# Patient Record
Sex: Female | Born: 1965 | Race: Black or African American | Hispanic: No | Smoking: Current every day smoker
Health system: Southern US, Community
[De-identification: ages and names within clinical notes are randomized; demographics above are authoritative.]

## PROBLEM LIST (undated history)

## (undated) DIAGNOSIS — F319 Bipolar disorder, unspecified: Secondary | ICD-10-CM

## (undated) DIAGNOSIS — J45909 Unspecified asthma, uncomplicated: Secondary | ICD-10-CM

## (undated) DIAGNOSIS — I10 Essential (primary) hypertension: Secondary | ICD-10-CM

## (undated) HISTORY — PX: APPENDECTOMY: SHX54

## (undated) HISTORY — PX: HERNIA REPAIR: SHX51

---

## 2000-04-07 ENCOUNTER — Encounter: Payer: Self-pay | Admitting: Emergency Medicine

## 2000-04-07 ENCOUNTER — Emergency Department (HOSPITAL_COMMUNITY): Admission: EM | Admit: 2000-04-07 | Discharge: 2000-04-07 | Payer: Self-pay | Admitting: Emergency Medicine

## 2000-12-17 ENCOUNTER — Other Ambulatory Visit: Admission: RE | Admit: 2000-12-17 | Discharge: 2000-12-17 | Payer: Self-pay | Admitting: Obstetrics and Gynecology

## 2001-03-01 ENCOUNTER — Other Ambulatory Visit: Admission: RE | Admit: 2001-03-01 | Discharge: 2001-03-01 | Payer: Self-pay | Admitting: Obstetrics and Gynecology

## 2002-03-13 ENCOUNTER — Other Ambulatory Visit: Admission: RE | Admit: 2002-03-13 | Discharge: 2002-03-13 | Payer: Self-pay | Admitting: Emergency Medicine

## 2002-03-13 ENCOUNTER — Inpatient Hospital Stay (HOSPITAL_COMMUNITY): Admission: AD | Admit: 2002-03-13 | Discharge: 2002-03-13 | Payer: Self-pay | Admitting: Obstetrics and Gynecology

## 2005-12-01 ENCOUNTER — Ambulatory Visit: Payer: Self-pay | Admitting: Family Medicine

## 2005-12-05 ENCOUNTER — Ambulatory Visit (HOSPITAL_COMMUNITY): Admission: RE | Admit: 2005-12-05 | Discharge: 2005-12-05 | Payer: Self-pay | Admitting: Family Medicine

## 2005-12-23 ENCOUNTER — Ambulatory Visit: Payer: Self-pay | Admitting: Family Medicine

## 2012-10-19 ENCOUNTER — Encounter (HOSPITAL_COMMUNITY): Payer: Self-pay | Admitting: Emergency Medicine

## 2012-10-19 ENCOUNTER — Emergency Department (INDEPENDENT_AMBULATORY_CARE_PROVIDER_SITE_OTHER)
Admission: EM | Admit: 2012-10-19 | Discharge: 2012-10-19 | Disposition: A | Discharge status: Home / Self Care | Payer: PRIVATE HEALTH INSURANCE | Source: Home / Self Care | Attending: Family Medicine | Admitting: Family Medicine

## 2012-10-19 DIAGNOSIS — J069 Acute upper respiratory infection, unspecified: Secondary | ICD-10-CM

## 2012-10-19 HISTORY — DX: Unspecified asthma, uncomplicated: J45.909

## 2012-10-19 HISTORY — DX: Essential (primary) hypertension: I10

## 2012-10-19 MED ORDER — ONDANSETRON HCL 4 MG PO TABS: 4.0000 mg | ORAL_TABLET | Freq: Four times a day (QID) | ORAL | Status: DC

## 2012-10-19 MED ORDER — ONDANSETRON 4 MG PO TBDP
ORAL_TABLET | ORAL | Status: AC
  Filled 2012-10-19: qty 1

## 2012-10-19 MED ORDER — ONDANSETRON 4 MG PO TBDP
4.0000 mg | ORAL_TABLET | Freq: Once | ORAL | Status: AC
  Administered 2012-10-19: 4 mg via ORAL

## 2012-10-19 MED ORDER — CETIRIZINE HCL 10 MG PO TABS: 10.0000 mg | ORAL_TABLET | Freq: Every day | ORAL | Status: DC

## 2012-10-19 NOTE — ED Notes (Signed)
Reports: symptoms started Friday with a scratchy throat. Saturday congestion and body aches. Monday nausea and diarrhea with low grade temp.  Pt has been taking tylenol cold and flu with mild relief.

## 2012-10-19 NOTE — ED Provider Notes (Signed)
History     CSN: [id number]  Arrival date & time 10/19/12  1431   First MD Initiated Contact with Patient 10/19/12 1552      Chief Complaint  Patient presents with  . Influenza    scratchy throat. congestion. body aches. diarrhea.     (Consider location/radiation/quality/duration/timing/severity/associated sxs/prior treatment) Patient is a 47 y.o. female presenting with flu symptoms. The history is provided by the patient.  Influenza Presenting symptoms: cough, diarrhea, myalgias, nausea, rhinorrhea and vomiting   Presenting symptoms: no shortness of breath   Severity:  Mild Progression:  Unchanged Associated symptoms: nasal congestion     Past Medical History  Diagnosis Date  . Asthma   . Hypertension     Past Surgical History  Procedure Laterality Date  . Hernia repair    . Appendectomy      History reviewed. No pertinent family history.  History  Substance Use Topics  . Smoking status: Current Every Day Smoker -- 0.50 packs/day    Types: Cigarettes  . Smokeless tobacco: Not on file  . Alcohol Use: Yes    OB History   Grav Para Term Preterm Abortions TAB SAB Ect Mult Living                  Review of Systems  Constitutional: Positive for appetite change.  HENT: Positive for congestion and rhinorrhea.   Respiratory: Positive for cough. Negative for shortness of breath.   Cardiovascular: Negative for leg swelling.  Gastrointestinal: Positive for nausea, vomiting and diarrhea.  Genitourinary: Negative.  Negative for dysuria, urgency and frequency.  Musculoskeletal: Positive for myalgias.    Allergies  Review of patient's allergies indicates no known allergies.  Home Medications   Current Outpatient Rx  Name  Route  Sig  Dispense  Refill  . Esomeprazole Magnesium (NEXIUM PO)   Oral   Take by mouth.         . cetirizine (ZYRTEC) 10 MG tablet   Oral   Take 1 tablet (10 mg total) by mouth daily. One tab daily for allergies   30 tablet   1    . ondansetron (ZOFRAN) 4 MG tablet   Oral   Take 1 tablet (4 mg total) by mouth every 6 (six) hours.   6 tablet   0     BP 120/68  Pulse 94  Temp(Src) 99.7 F (37.6 C) (Oral)  Resp 16  SpO2 100%  LMP 10/19/2012  Physical Exam  Nursing note and vitals reviewed. Constitutional: She is oriented to person, place, and time. She appears well-developed and well-nourished. No distress.  HENT:  Right Ear: External ear normal.  Left Ear: External ear normal.  Mouth/Throat: Oropharynx is clear and moist.  Neck: Normal range of motion. Neck supple.  Cardiovascular: Regular rhythm and normal heart sounds.   Pulmonary/Chest: Effort normal. She has no wheezes. She has no rales.  Abdominal: Soft. Bowel sounds are normal.  Lymphadenopathy:    She has no cervical adenopathy.  Neurological: She is alert and oriented to person, place, and time.  Skin: Skin is warm and dry.    ED Course  Procedures (including critical care time)  Labs Reviewed - No data to display No results found.   1. Acute URI       MDM          Linna Hoff, MD 10/19/12 1651

## 2012-11-19 ENCOUNTER — Emergency Department (HOSPITAL_COMMUNITY)
Admission: EM | Admit: 2012-11-19 | Discharge: 2012-11-19 | Disposition: A | Discharge status: Home / Self Care | Payer: PRIVATE HEALTH INSURANCE | Attending: Emergency Medicine | Admitting: Emergency Medicine

## 2012-11-19 ENCOUNTER — Emergency Department (HOSPITAL_COMMUNITY): Payer: PRIVATE HEALTH INSURANCE

## 2012-11-19 ENCOUNTER — Encounter (HOSPITAL_COMMUNITY): Payer: Self-pay | Admitting: Emergency Medicine

## 2012-11-19 DIAGNOSIS — Z79899 Other long term (current) drug therapy: Secondary | ICD-10-CM | POA: Insufficient documentation

## 2012-11-19 DIAGNOSIS — F17200 Nicotine dependence, unspecified, uncomplicated: Secondary | ICD-10-CM | POA: Insufficient documentation

## 2012-11-19 DIAGNOSIS — R1013 Epigastric pain: Secondary | ICD-10-CM | POA: Insufficient documentation

## 2012-11-19 DIAGNOSIS — J45909 Unspecified asthma, uncomplicated: Secondary | ICD-10-CM | POA: Insufficient documentation

## 2012-11-19 DIAGNOSIS — R110 Nausea: Secondary | ICD-10-CM | POA: Insufficient documentation

## 2012-11-19 DIAGNOSIS — Z3202 Encounter for pregnancy test, result negative: Secondary | ICD-10-CM | POA: Insufficient documentation

## 2012-11-19 DIAGNOSIS — I10 Essential (primary) hypertension: Secondary | ICD-10-CM | POA: Insufficient documentation

## 2012-11-19 DIAGNOSIS — R109 Unspecified abdominal pain: Secondary | ICD-10-CM

## 2012-11-19 LAB — CBC WITH DIFFERENTIAL/PLATELET
Basophils Absolute: 0 10*3/uL (ref 0.0–0.1)
Basophils Relative: 0 % (ref 0–1)
Eosinophils Absolute: 0.1 10*3/uL (ref 0.0–0.7)
Eosinophils Relative: 1 % (ref 0–5)
HCT: 41.3 % (ref 36.0–46.0)
Hemoglobin: 14 g/dL (ref 12.0–15.0)
Lymphocytes Relative: 19 % (ref 12–46)
Lymphs Abs: 1.3 10*3/uL (ref 0.7–4.0)
MCH: 33 pg (ref 26.0–34.0)
MCHC: 33.9 g/dL (ref 30.0–36.0)
MCV: 97.4 fL (ref 78.0–100.0)
Monocytes Absolute: 0.7 10*3/uL (ref 0.1–1.0)
Monocytes Relative: 10 % (ref 3–12)
Neutro Abs: 4.9 10*3/uL (ref 1.7–7.7)
Neutrophils Relative %: 70 % (ref 43–77)
Platelets: 252 10*3/uL (ref 150–400)
RBC: 4.24 MIL/uL (ref 3.87–5.11)
RDW: 13.7 % (ref 11.5–15.5)
WBC: 7.1 10*3/uL (ref 4.0–10.5)

## 2012-11-19 LAB — LIPASE, BLOOD: Lipase: 27 U/L (ref 11–59)

## 2012-11-19 LAB — COMPREHENSIVE METABOLIC PANEL
ALT: 13 U/L (ref 0–35)
AST: 19 U/L (ref 0–37)
Albumin: 3.6 g/dL (ref 3.5–5.2)
Alkaline Phosphatase: 73 U/L (ref 39–117)
BUN: 8 mg/dL (ref 6–23)
CO2: 27 mEq/L (ref 19–32)
Calcium: 9 mg/dL (ref 8.4–10.5)
Chloride: 103 mEq/L (ref 96–112)
Creatinine, Ser: 0.93 mg/dL (ref 0.50–1.10)
GFR calc Af Amer: 85 mL/min — ABNORMAL LOW (ref >90)
GFR calc non Af Amer: 73 mL/min — ABNORMAL LOW (ref >90)
Glucose, Bld: 88 mg/dL (ref 70–99)
Potassium: 3.8 mEq/L (ref 3.5–5.1)
Sodium: 138 mEq/L (ref 135–145)
Total Bilirubin: 1.7 mg/dL — ABNORMAL HIGH (ref 0.3–1.2)
Total Protein: 7.3 g/dL (ref 6.0–8.3)

## 2012-11-19 LAB — URINE MICROSCOPIC-ADD ON

## 2012-11-19 LAB — URINALYSIS, ROUTINE W REFLEX MICROSCOPIC
Bilirubin Urine: NEGATIVE
Glucose, UA: NEGATIVE mg/dL
Hgb urine dipstick: NEGATIVE
Ketones, ur: NEGATIVE mg/dL
Leukocytes, UA: NEGATIVE
Nitrite: NEGATIVE
Protein, ur: NEGATIVE mg/dL
Specific Gravity, Urine: 1.017 (ref 1.005–1.030)
Urobilinogen, UA: 1 mg/dL (ref 0.0–1.0)
pH: 8.5 — ABNORMAL HIGH (ref 5.0–8.0)

## 2012-11-19 LAB — PREGNANCY, URINE: Preg Test, Ur: NEGATIVE

## 2012-11-19 MED ORDER — PANTOPRAZOLE SODIUM 40 MG IV SOLR
40.0000 mg | Freq: Once | INTRAVENOUS | Status: AC
  Administered 2012-11-19: 40 mg via INTRAVENOUS
  Filled 2012-11-19: qty 40

## 2012-11-19 MED ORDER — TRAMADOL HCL 50 MG PO TABS: 50.0000 mg | ORAL_TABLET | Freq: Four times a day (QID) | ORAL | Status: DC | PRN

## 2012-11-19 NOTE — ED Provider Notes (Signed)
History     CSN: [id number]  Arrival date & time 11/19/12  1433   First MD Initiated Contact with Patient 11/19/12 1543      Chief Complaint  Patient presents with  . Abdominal Pain  . Nausea    (Consider location/radiation/quality/duration/timing/severity/associated sxs/prior treatment) Patient is a 47 y.o. female presenting with abdominal pain. The history is provided by the patient (the pt complains of epigastric pain).  Abdominal Pain Pain location:  Epigastric Pain quality: aching   Pain radiates to:  Does not radiate Pain severity:  Moderate Onset quality:  Gradual Timing:  Intermittent Progression:  Unchanged Chronicity:  Recurrent Context: not alcohol use   Associated symptoms: no chest pain, no cough, no diarrhea, no fatigue and no hematuria     Past Medical History  Diagnosis Date  . Asthma   . Hypertension     Past Surgical History  Procedure Laterality Date  . Hernia repair    . Appendectomy      History reviewed. No pertinent family history.  History  Substance Use Topics  . Smoking status: Current Every Day Smoker -- 0.50 packs/day    Types: Cigarettes  . Smokeless tobacco: Not on file  . Alcohol Use: Yes    OB History   Grav Para Term Preterm Abortions TAB SAB Ect Mult Living                  Review of Systems  Constitutional: Negative for appetite change and fatigue.  HENT: Negative for congestion, sinus pressure and ear discharge.   Eyes: Negative for discharge.  Respiratory: Negative for cough.   Cardiovascular: Negative for chest pain.  Gastrointestinal: Positive for abdominal pain. Negative for diarrhea.  Genitourinary: Negative for frequency and hematuria.  Musculoskeletal: Negative for back pain.  Skin: Negative for rash.  Neurological: Negative for seizures and headaches.  Psychiatric/Behavioral: Negative for hallucinations.    Allergies  Review of patient's allergies indicates no known allergies.  Home Medications    Current Outpatient Rx  Name  Route  Sig  Dispense  Refill  . esomeprazole (NEXIUM) 40 MG capsule   Oral   Take 40 mg by mouth daily before breakfast.         . traMADol (ULTRAM) 50 MG tablet   Oral   Take 1 tablet (50 mg total) by mouth every 6 (six) hours as needed for pain.   15 tablet   0     BP 108/73  Pulse 81  Temp(Src) 98.5 F (36.9 C) (Oral)  Resp 20  SpO2 100%  LMP 11/12/2012  Physical Exam  Constitutional: She is oriented to person, place, and time. She appears well-developed.  HENT:  Head: Normocephalic.  Eyes: Conjunctivae and EOM are normal. No scleral icterus.  Neck: Neck supple. No thyromegaly present.  Cardiovascular: Normal rate and regular rhythm.  Exam reveals no gallop and no friction rub.   No murmur heard. Pulmonary/Chest: No stridor. She has no wheezes. She has no rales. She exhibits no tenderness.  Abdominal: She exhibits no distension. There is tenderness. There is no rebound.  Musculoskeletal: Normal range of motion. She exhibits no edema.  Lymphadenopathy:    She has no cervical adenopathy.  Neurological: She is oriented to person, place, and time. Coordination normal.  Skin: No rash noted. No erythema.  Psychiatric: She has a normal mood and affect. Her behavior is normal.    ED Course  Procedures (including critical care time)  Labs Reviewed  COMPREHENSIVE METABOLIC PANEL -  Abnormal; Notable for the following:    Total Bilirubin 1.7 (*)    GFR calc non Af Amer 73 (*)    GFR calc Af Amer 85 (*)    All other components within normal limits  URINALYSIS, ROUTINE W REFLEX MICROSCOPIC - Abnormal; Notable for the following:    APPearance TURBID (*)    pH 8.5 (*)    All other components within normal limits  URINE MICROSCOPIC-ADD ON - Abnormal; Notable for the following:    Squamous Epithelial / LPF FEW (*)    Bacteria, UA FEW (*)    All other components within normal limits  LIPASE, BLOOD  CBC WITH DIFFERENTIAL  PREGNANCY, URINE    Dg Abd Acute W/chest  11/19/2012  *RADIOLOGY REPORT*  Clinical Data: Upper abdominal pain for 1 week, history smoking, asthma, hypertension  ACUTE ABDOMEN SERIES (ABDOMEN 2 VIEW & CHEST 1 VIEW)  Comparison: None  Findings: Normal heart size, mediastinal contours, and pulmonary vascularity. Peribronchial thickening without infiltrate, pleural effusion or pneumothorax. Scattered stool throughout colon. IUD projects over pelvis. Nonobstructive bowel gas pattern. No bowel dilatation, bowel wall thickening, or free intraperitoneal air. No urinary tract calcification or acute osseous findings.  IMPRESSION: No acute abdominal findings. Peribronchial thickening question bronchitis versus asthma.   Original Report Authenticated By: Ulyses Southward, M.D.      1. Abdominal pain       MDM          Benny Lennert, MD 11/19/12 [phone]

## 2012-11-19 NOTE — ED Notes (Signed)
Pt c/o abd pain after eating with some nausea x several weeks; pt sts hx of similar

## 2012-11-19 NOTE — ED Notes (Signed)
Patient transported to X-ray 

## 2012-11-19 NOTE — ED Notes (Signed)
Patient said she has been having abdominal pain for a week now and she has been managing the pain at home with motrin.  Also her mom gave her a "pain pill".  She said what made her come in is that she started getting nauseous with food and it worried her so she came to the ED to be evaluated.

## 2013-05-26 ENCOUNTER — Encounter (HOSPITAL_COMMUNITY): Payer: Self-pay | Admitting: Emergency Medicine

## 2013-05-26 ENCOUNTER — Emergency Department (HOSPITAL_COMMUNITY): Payer: Self-pay

## 2013-05-26 ENCOUNTER — Emergency Department (HOSPITAL_COMMUNITY)
Admission: EM | Admit: 2013-05-26 | Discharge: 2013-05-26 | Disposition: A | Discharge status: Home / Self Care | Payer: PRIVATE HEALTH INSURANCE | Attending: Emergency Medicine | Admitting: Emergency Medicine

## 2013-05-26 DIAGNOSIS — J45909 Unspecified asthma, uncomplicated: Secondary | ICD-10-CM | POA: Insufficient documentation

## 2013-05-26 DIAGNOSIS — Z79899 Other long term (current) drug therapy: Secondary | ICD-10-CM | POA: Insufficient documentation

## 2013-05-26 DIAGNOSIS — S92501A Displaced unspecified fracture of right lesser toe(s), initial encounter for closed fracture: Secondary | ICD-10-CM

## 2013-05-26 DIAGNOSIS — W2203XA Walked into furniture, initial encounter: Secondary | ICD-10-CM | POA: Insufficient documentation

## 2013-05-26 DIAGNOSIS — Y9301 Activity, walking, marching and hiking: Secondary | ICD-10-CM | POA: Insufficient documentation

## 2013-05-26 DIAGNOSIS — I10 Essential (primary) hypertension: Secondary | ICD-10-CM | POA: Insufficient documentation

## 2013-05-26 DIAGNOSIS — Y929 Unspecified place or not applicable: Secondary | ICD-10-CM | POA: Insufficient documentation

## 2013-05-26 DIAGNOSIS — F17200 Nicotine dependence, unspecified, uncomplicated: Secondary | ICD-10-CM | POA: Insufficient documentation

## 2013-05-26 DIAGNOSIS — S92919A Unspecified fracture of unspecified toe(s), initial encounter for closed fracture: Secondary | ICD-10-CM | POA: Insufficient documentation

## 2013-05-26 DIAGNOSIS — R269 Unspecified abnormalities of gait and mobility: Secondary | ICD-10-CM | POA: Insufficient documentation

## 2013-05-26 DIAGNOSIS — R209 Unspecified disturbances of skin sensation: Secondary | ICD-10-CM | POA: Insufficient documentation

## 2013-05-26 MED ORDER — HYDROCODONE-ACETAMINOPHEN 5-325 MG PO TABS: 1.0000 | ORAL_TABLET | Freq: Four times a day (QID) | ORAL | Status: DC | PRN

## 2013-05-26 NOTE — Progress Notes (Signed)
Orthopedic Tech Progress Note Patient Details:  Katherine Ramirez 05-05-1966 [id number] SLS applied to Right LE. Posterior. Crutches fit for height and comfort.  Ortho Devices Type of Ortho Device: Crutches;Short leg splint Ortho Device/Splint Location: Right LE Ortho Device/Splint Interventions: Application   Asia R Thompson 05/26/2013, 1:52 PM

## 2013-05-26 NOTE — ED Notes (Signed)
Pt reports hitting right foot on furniture early this morning. Pt has pain and swelling to right little toe.

## 2013-05-26 NOTE — ED Notes (Signed)
Waiting on ortho to place splints.

## 2013-05-26 NOTE — ED Provider Notes (Signed)
CSN: [id number]     Arrival date & time 05/26/13  1043 History  This chart was scribed for non-physician practitioner working with Katherine Camel, MD by Ashley Jacobs, ED scribe. This patient was seen in room TR04C/TR04C and the patient's care was started at 12:29 PM.  First MD Initiated Contact with Patient 05/26/13 1133     Chief Complaint  Patient presents with  . Foot Pain   (Consider location/radiation/quality/duration/timing/severity/associated sxs/prior Treatment) The history is provided by the patient and medical records. No language interpreter was used.   HPI Comments: Katherine Ramirez is a 47 y.o. female who presents to the Emergency Department complaining of constant, sharp, right foot pain that occurred this morning after hitting his foot on furniture. She states the pain started immediately after impact.   Pt has the associated symptom of swelling,numbness and slight tingling in her right fifth toe. The pain is worse with the application of weight and upon walking. She has a previous injury to her right foot. She had a past medical hx of asthma and HTN. Pt smokes tobacco every day and drinks alcohol.  Past Medical History  Diagnosis Date  . Asthma   . Hypertension    Past Surgical History  Procedure Laterality Date  . Hernia repair    . Appendectomy     History reviewed. No pertinent family history. History  Substance Use Topics  . Smoking status: Current Every Day Smoker -- 0.50 packs/day    Types: Cigarettes  . Smokeless tobacco: Not on file  . Alcohol Use: Yes   OB History   Grav Para Term Preterm Abortions TAB SAB Ect Mult Living                 Review of Systems  Musculoskeletal: Positive for arthralgias (right fifth toe) and gait problem.  Neurological: Positive for numbness.  All other systems reviewed and are negative.    Allergies  Review of patient's allergies indicates no known allergies.  Home Medications   Current Outpatient Rx  Name   Route  Sig  Dispense  Refill  . esomeprazole (NEXIUM) 40 MG capsule   Oral   Take 40 mg by mouth daily before breakfast.         . ezetimibe (ZETIA) 10 MG tablet   Oral   Take 10 mg by mouth daily.         Marland Kitchen FLUoxetine (PROZAC) 20 MG tablet   Oral   Take 20 mg by mouth daily.         . traMADol (ULTRAM) 50 MG tablet   Oral   Take 1 tablet (50 mg total) by mouth every 6 (six) hours as needed for pain.   15 tablet   0   . HYDROcodone-acetaminophen (NORCO/VICODIN) 5-325 MG per tablet   Oral   Take 1 tablet by mouth every 6 (six) hours as needed.   7 tablet   0    BP 117/78  Pulse 72  Temp(Src) 98.1 F (36.7 C) (Oral)  Resp 20  Ht 5\' 3"  (1.6 m)  Wt 151 lb 4.8 oz (68.629 kg)  BMI 26.81 kg/m2  SpO2 98% Physical Exam  Nursing note and vitals reviewed. Constitutional: She is oriented to person, place, and time. She appears well-developed and well-nourished. No distress.  HENT:  Head: Normocephalic and atraumatic.  Cardiovascular: Normal rate, regular rhythm and normal heart sounds.  Exam reveals no friction rub.   No murmur heard. Pulses:      Radial  pulses are 2+ on the right side, and 2+ on the left side.       Dorsalis pedis pulses are 2+ on the right side, and 2+ on the left side.  Pulmonary/Chest: Effort normal and breath sounds normal. No respiratory distress. She has no wheezes. She has no rales.  Musculoskeletal: She exhibits tenderness.  Swelling localized to the fifth digit of the right foot-mild ecchymosis identified. Pain upon palpation to the fifth digit of the right foot with discomfort upon the lateral aspects of the right foot. Full range of motion to remaining digits, decreased range of motion to fifth digit secondary to pain.  Neurological: She is alert and oriented to person, place, and time. She exhibits normal muscle tone. Coordination normal.  Strength 5+/5+ bilateral lower extremities Sensation intact with differentiation to sharp and dull touch.   Skin: Skin is warm and dry. No rash noted. She is not diaphoretic. No erythema.  Psychiatric: She has a normal mood and affect. Her behavior is normal. Thought content normal.    ED Course  Procedures (including critical care time) DIAGNOSTIC STUDIES: Oxygen Saturation is 98% on room air, normal by my interpretation.    COORDINATION OF CARE: 12:34 PM Discussed course of care with pt . Pt understands and agrees.  Labs Review Labs Reviewed - No data to display Imaging Review Dg Foot Complete Right  05/26/2013   CLINICAL DATA:  47 year old female with pain and swelling after injury. Initial encounter.  EXAM: RIGHT FOOT COMPLETE - 3+ VIEW  COMPARISON:  None.  FINDINGS: Oblique perhaps mildly comminuted intra-articular fracture at the base of the right 5th proximal phalanx. Lateral foot soft tissue swelling. No dislocation. Fifth metacarpal intact.  Other osseous structures appear intact. Bone mineralization is within normal limits.  IMPRESSION: Mildly comminuted intra-articular fracture at the base of the right 5th proximal phalanx.   Electronically Signed   By: Augusto Gamble M.D.   On: 05/26/2013 12:07    EKG Interpretation   None       MDM   1. Closed fracture of fifth toe of right foot, initial encounter     Filed Vitals:   05/26/13 1103 05/26/13 1313  BP: 117/78 114/76  Pulse: 72 76  Temp: 98.1 F (36.7 C)   TempSrc: Oral   Resp: 20 18  Height: 5\' 3"  (1.6 m)   Weight: 151 lb 4.8 oz (68.629 kg)   SpO2: 98% 100%    I personally performed the services described in this documentation, which was scribed in my presence. The recorded information has been reviewed and is accurate.  Patient presenting to emergency department with fifth digit of right foot pain secondary to hitting the toe on the door this morning. Alert and oriented. Swelling and mild ecchymosis to the fifth digit of the right foot. Pain upon palpation to the fifth digit of the right foot with discomfort on the  lateral aspect of the right foot. Decreased range of motion to the fifth digit of the right foot secondary to pain. Pulses palpable and strong, DP 2+ bilaterally. Sensation intact. Patient neurovascularly intact. Plain film of right foot noted mildly comminuted intra-articular fracture at the base of the right fifth proximal phalanx-closed fracture. Patient presenting with closed fracture to the fifth digit of the right foot. Patient placed in splint and crutches administered. Small dose of pain medications administered-discussed course, precautions, disposal technique. Discussed with patient to rest and stay hydrated. Discussed with patient to keep foot elevated. Discussed with patient to  avoid any physical or strenuous activities. Referred to orthopedics. Discussed with patient to closely monitor symptoms and if symptoms are to worsen or change report back to emergency department-strict return structures given. Patient agreed to plan of care, understood, all cautions answered.  Raymon Mutton, PA-C 05/26/13 2155

## 2013-05-26 NOTE — ED Notes (Signed)
Ortho tech still in with pt.

## 2013-05-27 NOTE — ED Provider Notes (Signed)
Medical screening examination/treatment/procedure(s) were performed by non-physician practitioner and as supervising physician I was immediately available for consultation/collaboration.  EKG Interpretation   None         Otie Headlee T Oniyah Rohe, MD 05/27/13 0703 

## 2016-03-20 ENCOUNTER — Emergency Department (HOSPITAL_COMMUNITY)
Admission: EM | Admit: 2016-03-20 | Discharge: 2016-03-20 | Disposition: A | Discharge status: Home / Self Care | Payer: PRIVATE HEALTH INSURANCE | Attending: Emergency Medicine | Admitting: Emergency Medicine

## 2016-03-20 ENCOUNTER — Encounter (HOSPITAL_COMMUNITY): Payer: Self-pay | Admitting: Emergency Medicine

## 2016-03-20 ENCOUNTER — Emergency Department (HOSPITAL_COMMUNITY): Payer: PRIVATE HEALTH INSURANCE

## 2016-03-20 DIAGNOSIS — M778 Other enthesopathies, not elsewhere classified: Secondary | ICD-10-CM | POA: Insufficient documentation

## 2016-03-20 DIAGNOSIS — T148XXA Other injury of unspecified body region, initial encounter: Secondary | ICD-10-CM

## 2016-03-20 DIAGNOSIS — I10 Essential (primary) hypertension: Secondary | ICD-10-CM | POA: Insufficient documentation

## 2016-03-20 DIAGNOSIS — F17210 Nicotine dependence, cigarettes, uncomplicated: Secondary | ICD-10-CM | POA: Insufficient documentation

## 2016-03-20 DIAGNOSIS — J45909 Unspecified asthma, uncomplicated: Secondary | ICD-10-CM | POA: Insufficient documentation

## 2016-03-20 DIAGNOSIS — M25532 Pain in left wrist: Secondary | ICD-10-CM

## 2016-03-20 DIAGNOSIS — X503XXA Overexertion from repetitive movements, initial encounter: Secondary | ICD-10-CM | POA: Insufficient documentation

## 2016-03-20 MED ORDER — IBUPROFEN 800 MG PO TABS
800.0000 mg | ORAL_TABLET | Freq: Once | ORAL | Status: AC
  Administered 2016-03-20: 800 mg via ORAL
  Filled 2016-03-20: qty 1

## 2016-03-20 NOTE — Discharge Instructions (Addendum)
Wear wrist brace for the next 1-2 weeks as needed for comfort. Ice and elevate wrist throughout the day, using ice pack for no more than 20 minutes every hour.  Alternate between tylenol and motrin for pain relief. Call hand specialist follow up today or tomorrow to schedule followup appointment for recheck of ongoing wrist pain in 1-2 weeks that can be canceled with a 24-48 hour notice if complete resolution of pain. Return to the ER for changes or worsening symptoms.

## 2016-03-20 NOTE — ED Triage Notes (Signed)
Pt reports l/wrist pain x 3 days. Denies trauma, reports that she lifts heavy boxes at work

## 2016-03-20 NOTE — ED Provider Notes (Signed)
[facility]-EMERGENCY DEPT Provider Note   CSN: [id number] Arrival date & time: 03/20/16  1654  By signing my name below, I, Soijett Blue, attest that this documentation has been prepared under the direction and in the presence of Levi StraussMercedes Ramirez, VF CorporationPA-C Electronically Signed: Soijett Blue, ED Scribe. 03/20/16. 5:40 PM.   History   Chief Complaint Chief Complaint  Patient presents with  . Wrist Pain    pain x 3 days    HPI Katherine Ramirez is a 50 y.o. female with a PMHx of HTN, who presents to the Emergency Department complaining of left medial wrist pain onset 3 days. 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 Pt describes her left medial wrist pain as 9/10, constant, sharp/burning, and it radiates up her right forearm. She states that her left medial wrist pain is worsened with medial deviation and pronation movements. She reports that she has tried heat/cold compresses and ASA with mild relief for her symptoms. Pt is having associated symptoms of left wrist swelling. Pt denies numbness, tingling, weakness, bruising, redness, warmth, fever, chills CP, SOB, abdominal pain, nausea, vomiting, diarrhea, constipation, hematuria, dysuria, and any other symptoms.     The history is provided by the patient. No language interpreter was used.  Wrist Pain  This is a new problem. The current episode started more than 2 days ago (3 days). The problem occurs constantly. The problem has not changed since onset.Pertinent negatives include no chest pain, no abdominal pain and no shortness of breath. The symptoms are aggravated by twisting (movement). Nothing relieves the symptoms. She has tried a cold compress, a warm compress and ASA for the symptoms. The treatment provided mild relief.    Past Medical History:  Diagnosis Date  .  Asthma   . Hypertension     There are no active problems to display for this patient.   Past Surgical History:  Procedure Laterality Date  . APPENDECTOMY    . HERNIA REPAIR      OB History    No data available       Home Medications    Prior to Admission medications   Medication Sig Start Date End Date Taking? Authorizing Provider  esomeprazole (NEXIUM) 40 MG capsule Take 40 mg by mouth daily before breakfast.    Historical Provider, MD  ezetimibe (ZETIA) 10 MG tablet Take 10 mg by mouth daily.    Historical Provider, MD  FLUoxetine (PROZAC) 20 MG tablet Take 20 mg by mouth daily.    Historical Provider, MD  HYDROcodone-acetaminophen (NORCO/VICODIN) 5-325 MG per tablet Take 1 tablet by mouth every 6 (six) hours as needed. 05/26/13   Marissa Sciacca, PA-C  traMADol (ULTRAM) 50 MG tablet Take 1 tablet (50 mg total) by mouth every 6 (six) hours as needed for pain. 11/19/12   Bethann BerkshireJoseph Zammit, MD    Family History Family History  Problem Relation Age of Onset  . Hypertension Mother   . Heart failure Father     Social History Social History  Substance Use Topics  . Smoking status: Current Every Day Smoker    Packs/day: 0.50    Types: Cigarettes  . Smokeless tobacco: Never Used  . Alcohol use Yes     Allergies   Review of patient's allergies indicates no known allergies.   Review of Systems  Review of Systems  Constitutional: Negative for chills and fever.  Respiratory: Negative for shortness of breath.   Cardiovascular: Negative for chest pain.  Gastrointestinal: Negative for abdominal pain, constipation, diarrhea, nausea and vomiting.  Genitourinary: Negative for dysuria and hematuria.  Musculoskeletal: Positive for arthralgias (left wrist) and joint swelling (left wrist).  Skin: Negative for color change.  Allergic/Immunologic: Negative for immunocompromised state.  Neurological: Negative for weakness and numbness.  Psychiatric/Behavioral: Negative for confusion.    A complete 10 system review of systems was obtained and all systems are negative except as noted in the HPI and PMH.   Physical Exam Updated Vital Signs BP 131/73 (BP Location: Right Arm)   Temp 98.2 F (36.8 C) (Oral)   Resp 18   LMP 03/20/2016 (Exact Date)   SpO2 99%   Physical Exam  Constitutional: She is oriented to person, place, and time. Vital signs are normal. She appears well-developed and well-nourished.  Non-toxic appearance. No distress.  Afebrile, nontoxic, NAD  HENT:  Head: Normocephalic and atraumatic.  Mouth/Throat: Mucous membranes are normal.  Eyes: Conjunctivae and EOM are normal. Right eye exhibits no discharge. Left eye exhibits no discharge.  Neck: Normal range of motion. Neck supple.  Cardiovascular: Normal rate and intact distal pulses.   Pulmonary/Chest: Effort normal. No respiratory distress.  Abdominal: Normal appearance. She exhibits no distension.  Musculoskeletal:       Left wrist: She exhibits decreased range of motion (due to pain), tenderness and bony tenderness. She exhibits no swelling, no effusion, no crepitus, no deformity and no laceration.       Hands: Left wrist with limited ROM due to pain, with mild TTP to the ulnar aspect, no anatomical snuff box or radial aspect TTP, no TTP to the hand, no swelling or effusion, no crepitus or deformity, no bruising or erythema, no warmth, Strength and sensation grossly intact. Distal pulses intact, compartments soft.   Neurological: She is alert and oriented to person, place, and time. She has normal strength. No sensory deficit.  Skin: Skin is warm, dry and intact. No rash noted.  Psychiatric: She has a normal mood and affect. Her behavior is normal.  Nursing note and vitals reviewed.    ED Treatments / Results  DIAGNOSTIC STUDIES: Oxygen Saturation is 99% on RA, nl by my interpretation.    COORDINATION OF CARE: 5:39 PM Discussed treatment plan with pt at bedside which includes ibuprofen, left  wrist xray, and pt agreed to plan.   Radiology Dg Wrist Complete Left  Result Date: 03/20/2016 CLINICAL DATA:  Left wrist pain for 3 days, no known injury EXAM: LEFT WRIST - COMPLETE 3+ VIEW COMPARISON:  None. FINDINGS: Four views of the left wrist submitted. No acute fracture or subluxation. No periosteal reaction or bony erosion. IMPRESSION: Negative. Electronically Signed   By: Natasha Mead M.D.   On: 03/20/2016 18:21    Procedures Procedures (including critical care time)  Medications Ordered in ED Medications  ibuprofen (ADVIL,MOTRIN) tablet 800 mg (800 mg Oral Given 03/20/16 1750)     Initial Impression / Assessment and Plan / ED Course  I have reviewed the triage vital signs and the nursing notes.  Pertinent imaging results that were available during my care of the patient were reviewed by me and considered in my medical decision making (see chart for details).  Clinical Course    50 y.o. female here with L wrist pain x 3 days, does a lot of repetitive movements at work lifting boxes, has  struck the wrist on boxes in the past but doesn't recall any specific injury. Pain with pronation and medial deviation. Tenderness to ulnar region of wrist, ROM limited due to pain, no swelling/warmth/erythema/bruising, NVI with soft compartments. Will obtain L wrist xray and give ibuprofen, then reassess shortly  6:44 PM Xray neg. Likely repetitive movement strain injury from lifting boxes, vs sprain/tendinitis. Discussed RICE, tylenol/motrin for pain, wrist splint as needed for comfort x1-2wks, and hand specialist f/up in 1-2wks for recheck of ongoing symptoms if they persist. I explained the diagnosis and have given explicit precautions to return to the ER including for any other new or worsening symptoms. The patient understands and accepts the medical plan as it's been dictated and I have answered their questions. Discharge instructions concerning home care and prescriptions have been given. The  patient is STABLE and is discharged to home in good condition.   Final Clinical Impressions(s) / ED Diagnoses   Final diagnoses:  Left wrist pain  Repetitive motion injury  Tendinitis of left wrist    New Prescriptions New Prescriptions   No medications on file    I personally performed the services described in this documentation, which was scribed in my presence. The recorded information has been reviewed and is accurate.     Jahrell Hamor Camprubi-Soms, PA-C 03/20/16 1844    Benjiman Core, MD 03/21/16 [id number]

## 2016-05-21 ENCOUNTER — Emergency Department (HOSPITAL_COMMUNITY)
Admission: EM | Admit: 2016-05-21 | Discharge: 2016-05-21 | Disposition: A | Discharge status: Home / Self Care | Payer: PRIVATE HEALTH INSURANCE | Attending: Emergency Medicine | Admitting: Emergency Medicine

## 2016-05-21 ENCOUNTER — Encounter (HOSPITAL_COMMUNITY): Payer: Self-pay | Admitting: Emergency Medicine

## 2016-05-21 DIAGNOSIS — G43909 Migraine, unspecified, not intractable, without status migrainosus: Secondary | ICD-10-CM | POA: Insufficient documentation

## 2016-05-21 DIAGNOSIS — F17210 Nicotine dependence, cigarettes, uncomplicated: Secondary | ICD-10-CM | POA: Insufficient documentation

## 2016-05-21 DIAGNOSIS — I10 Essential (primary) hypertension: Secondary | ICD-10-CM | POA: Insufficient documentation

## 2016-05-21 DIAGNOSIS — G43009 Migraine without aura, not intractable, without status migrainosus: Secondary | ICD-10-CM

## 2016-05-21 DIAGNOSIS — J45909 Unspecified asthma, uncomplicated: Secondary | ICD-10-CM | POA: Insufficient documentation

## 2016-05-21 DIAGNOSIS — Z79899 Other long term (current) drug therapy: Secondary | ICD-10-CM | POA: Insufficient documentation

## 2016-05-21 MED ORDER — PROCHLORPERAZINE EDISYLATE 5 MG/ML IJ SOLN
10.0000 mg | Freq: Once | INTRAMUSCULAR | Status: AC
  Administered 2016-05-21: 10 mg via INTRAVENOUS
  Filled 2016-05-21: qty 2

## 2016-05-21 MED ORDER — DIPHENHYDRAMINE HCL 50 MG/ML IJ SOLN
25.0000 mg | Freq: Once | INTRAMUSCULAR | Status: AC
  Administered 2016-05-21: 25 mg via INTRAVENOUS
  Filled 2016-05-21: qty 1

## 2016-05-21 NOTE — ED Provider Notes (Signed)
[facility]-EMERGENCY DEPT Provider Note   CSN: [id number] Arrival date & time: 05/21/16  1541     History   Chief Complaint Chief Complaint  Patient presents with  . Headache    HPI Katherine Ramirez is a 50 y.o. female with a history of migraines presents to the emergency department noting a 2 week history of waxing waning left-sided headache with associated blue, black spots in her vision and associated nausea but no vomiting. She states that it feels similar to her previous migraine headaches. She states she has tried OTC medications intermittently to no avail of her symptoms. She denies any neck pain, fever, neck stiffness, numbness, weakness, dizziness, chest pain, palpitations, shortness of breath.  Additionally the patient notes a 1-2 month history of a small bump just anterior to her left ear which has been very slowly increasing in size. She denies any associated tenderness, redness, warmth, drainage. Denies any history of similar lesions. Denies any changes in hearing, ear pressure, tinnitus.  HPI  Past Medical History:  Diagnosis Date  . Asthma   . Hypertension     There are no active problems to display for this patient.   Past Surgical History:  Procedure Laterality Date  . APPENDECTOMY    . HERNIA REPAIR      OB History    No data available       Home Medications    Prior to Admission medications   Medication Sig Start Date End Date Taking? Authorizing Provider  citalopram (CELEXA) 20 MG tablet Take 20 mg by mouth daily.   Yes Historical Provider, MD  esomeprazole (NEXIUM) 40 MG capsule Take 40 mg by mouth daily before breakfast.   Yes Historical Provider, MD  ezetimibe (ZETIA) 10 MG tablet Take 10 mg by mouth daily.   Yes Historical Provider, MD  traMADol (ULTRAM) 50 MG tablet Take 1 tablet (50 mg total) by mouth every 6 (six) hours as needed for pain. 11/19/12  Yes Bethann BerkshireJoseph Zammit, MD  HYDROcodone-acetaminophen (NORCO/VICODIN) 5-325 MG per tablet Take 1 tablet by  mouth every 6 (six) hours as needed. Patient not taking: Reported on 05/21/2016 05/26/13   Raymon MuttonMarissa Sciacca, PA-C    Family History Family History  Problem Relation Age of Onset  . Hypertension Mother   . Heart failure Father     Social History Social History  Substance Use Topics  . Smoking status: Current Every Day Smoker    Packs/day: 0.50    Types: Cigarettes  . Smokeless tobacco: Never Used  . Alcohol use Yes     Allergies   Patient has no known allergies.   Review of Systems Review of Systems  Constitutional: Negative for chills and fever.  HENT: Negative for congestion, ear pain, facial swelling, hearing loss, rhinorrhea, sinus pressure, sneezing, sore throat and tinnitus.   Eyes: Positive for visual disturbance. Negative for photophobia.  Respiratory: Negative for cough and shortness of breath.   Cardiovascular: Negative for chest pain.  Gastrointestinal: Positive for nausea. Negative for abdominal pain, diarrhea and vomiting.  Genitourinary: Negative for dysuria, frequency and urgency.  Musculoskeletal: Negative for back pain, neck pain and neck stiffness.  Skin: Negative for rash.  Neurological: Positive for headaches. Negative for dizziness, syncope, speech difficulty, weakness, light-headedness and numbness.  All other systems reviewed and are negative.    Physical Exam Updated Vital Signs BP 154/81   Pulse 62   Temp 97.9 F (36.6 C) (Oral)   Resp 22   SpO2 100%   Physical Exam  Constitutional: She is oriented to person, place, and time. She appears well-developed and well-nourished. No distress.  HENT:  Head: Normocephalic and atraumatic.    Mouth/Throat: Oropharynx is clear and moist.  Small circular cystic appearing lesion just anterior to the left ear wirth no erythema, calor, or drainage or pustule. Nontender, freely mobile subcutaneously. Appears to be a sebaceous cyst.   Eyes: Conjunctivae and EOM are normal. Pupils are equal, round, and  reactive to light.  Neck: Normal range of motion. Neck supple.  Cardiovascular: Normal rate, regular rhythm, normal heart sounds and intact distal pulses.   Pulmonary/Chest: Effort normal and breath sounds normal.  Abdominal: Soft. She exhibits no distension. There is no tenderness.  Musculoskeletal: She exhibits no edema or tenderness.  Lymphadenopathy:    She has no cervical adenopathy.  Neurological: She is alert and oriented to person, place, and time. She has normal strength. No cranial nerve deficit or sensory deficit. She exhibits normal muscle tone. Coordination and gait normal. GCS eye subscore is 4. GCS verbal subscore is 5. GCS motor subscore is 6.  Skin: Skin is warm and dry. No rash noted. She is not diaphoretic.  Nursing note and vitals reviewed.    ED Treatments / Results  Labs (all labs ordered are listed, but only abnormal results are displayed) Labs Reviewed - No data to display  EKG  EKG Interpretation None       Radiology No results found.  Procedures Procedures (including critical care time)  Medications Ordered in ED Medications  prochlorperazine (COMPAZINE) injection 10 mg (10 mg Intravenous Given 05/21/16 1839)  diphenhydrAMINE (BENADRYL) injection 25 mg (25 mg Intravenous Given 05/21/16 1838)     Initial Impression / Assessment and Plan / ED Course  I have reviewed the triage vital signs and the nursing notes.  Pertinent labs & imaging results that were available during my care of the patient were reviewed by me and considered in my medical decision making (see chart for details).  Clinical Course    50 year old female presents with migraine headache. She is given migraine cocktail with compazine and Benadryl and had significant improvement in her symptoms. Feel that her small bump on the side of her face is likely a sebaceous cyst. She was recommended to monitor this over time and to follow up with her primary care physician. Return precautions  were given for concerning signs of abscess or worsening. This plan was discussed with the patient at the bedside and she stated both understanding and agreement.   Final Clinical Impressions(s) / ED Diagnoses   Final diagnoses:  Migraine without aura and without status migrainosus, not intractable    New Prescriptions Discharge Medication List as of 05/21/2016  7:21 PM       Francoise CeoWarren S Grayling Schranz, DO 05/22/16 1059    Rolland PorterMark James, MD 06/10/16 1552

## 2016-05-21 NOTE — ED Notes (Signed)
ED Provider at bedside. 

## 2016-05-21 NOTE — ED Triage Notes (Signed)
Pt sts [facility] with blurred vision and floaters x 2 weeks

## 2017-05-11 ENCOUNTER — Other Ambulatory Visit: Payer: Self-pay | Admitting: Obstetrics & Gynecology

## 2017-05-11 DIAGNOSIS — Z1231 Encounter for screening mammogram for malignant neoplasm of breast: Secondary | ICD-10-CM

## 2017-05-30 IMAGING — CR DG WRIST COMPLETE 3+V*L*
4 series · 4 of 4 positions shown · non-contrast
Comparison: None.

CLINICAL DATA: Left wrist pain for 3 days, no known injury

EXAM:
LEFT WRIST - COMPLETE 3+ VIEW

[x wrist pa left]
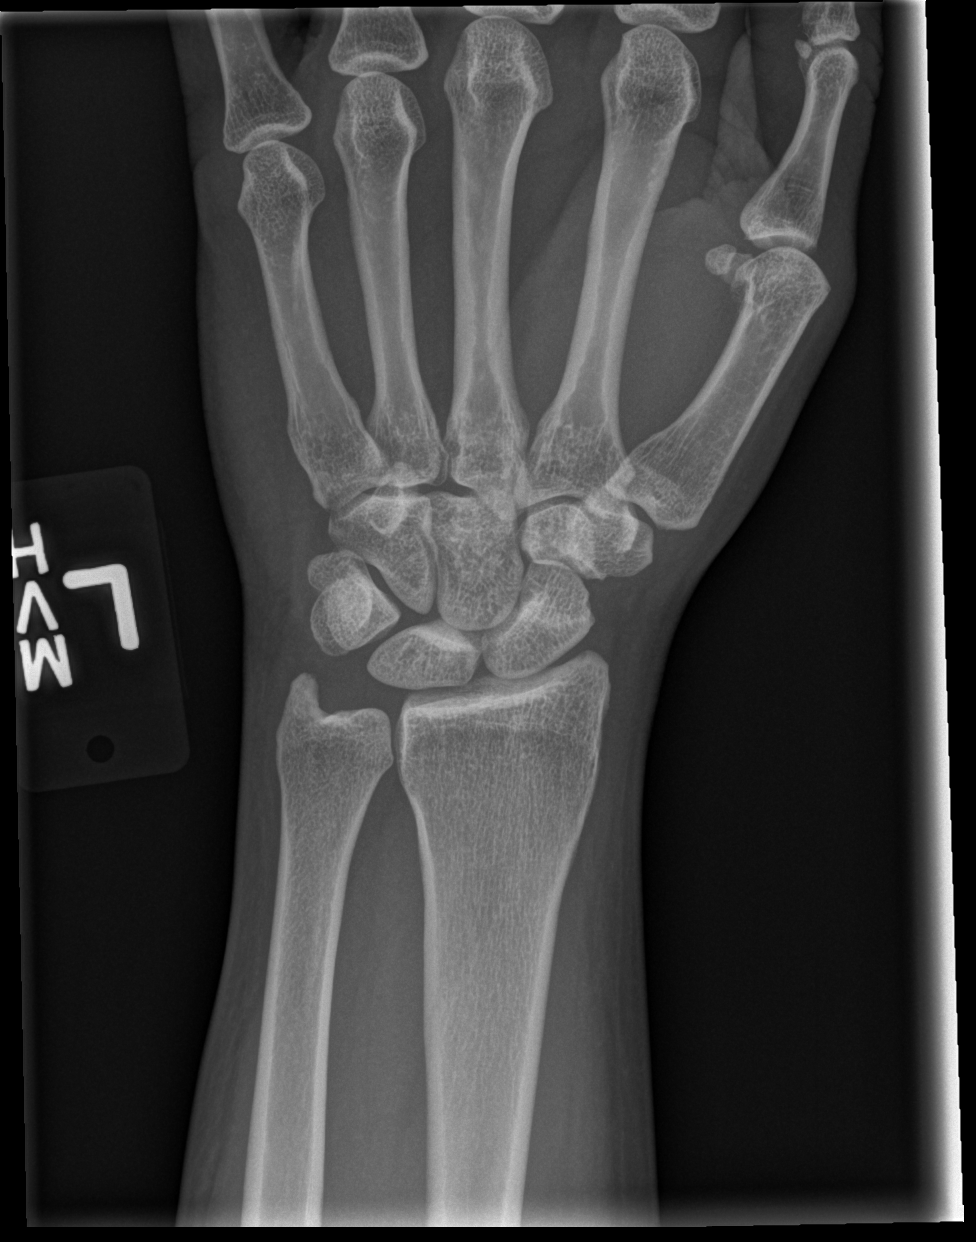

[x wrist obl left]
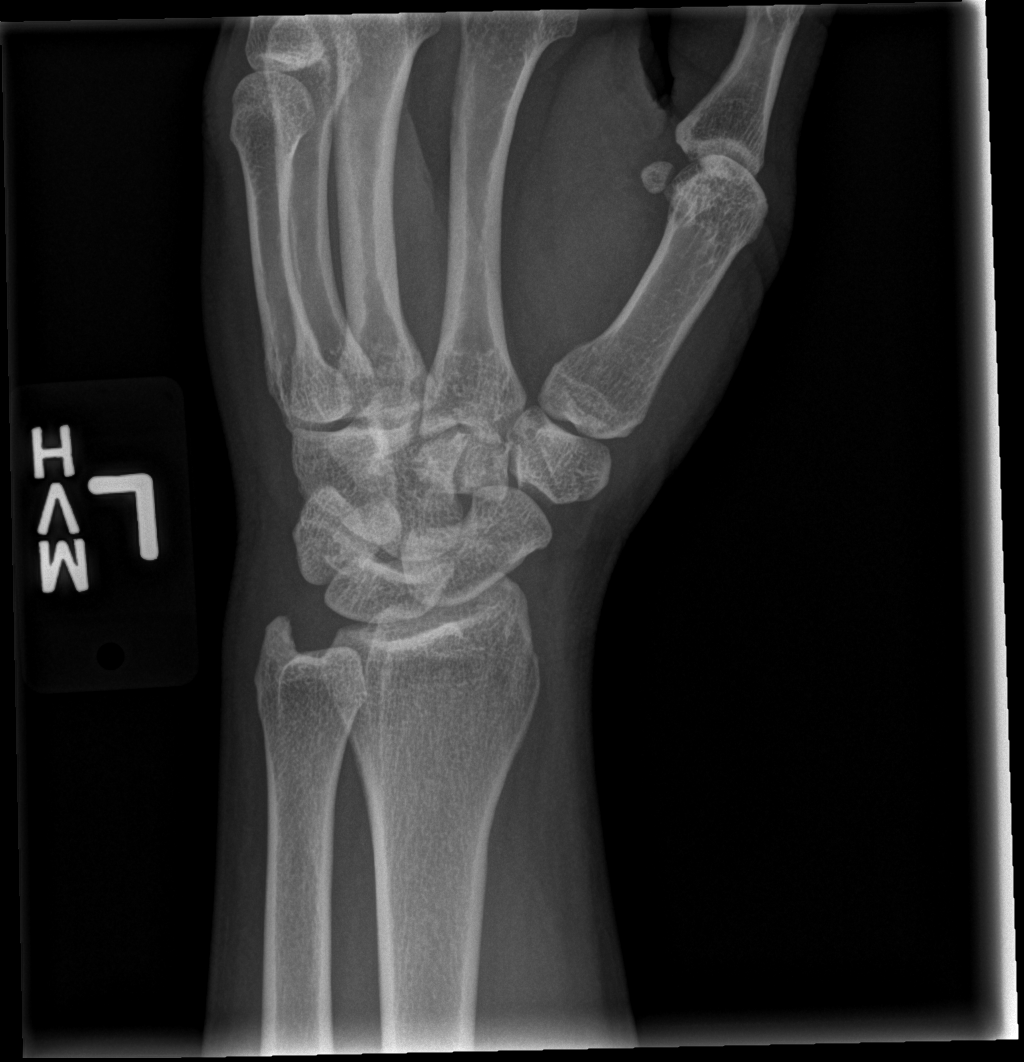

[x wrist lat left]
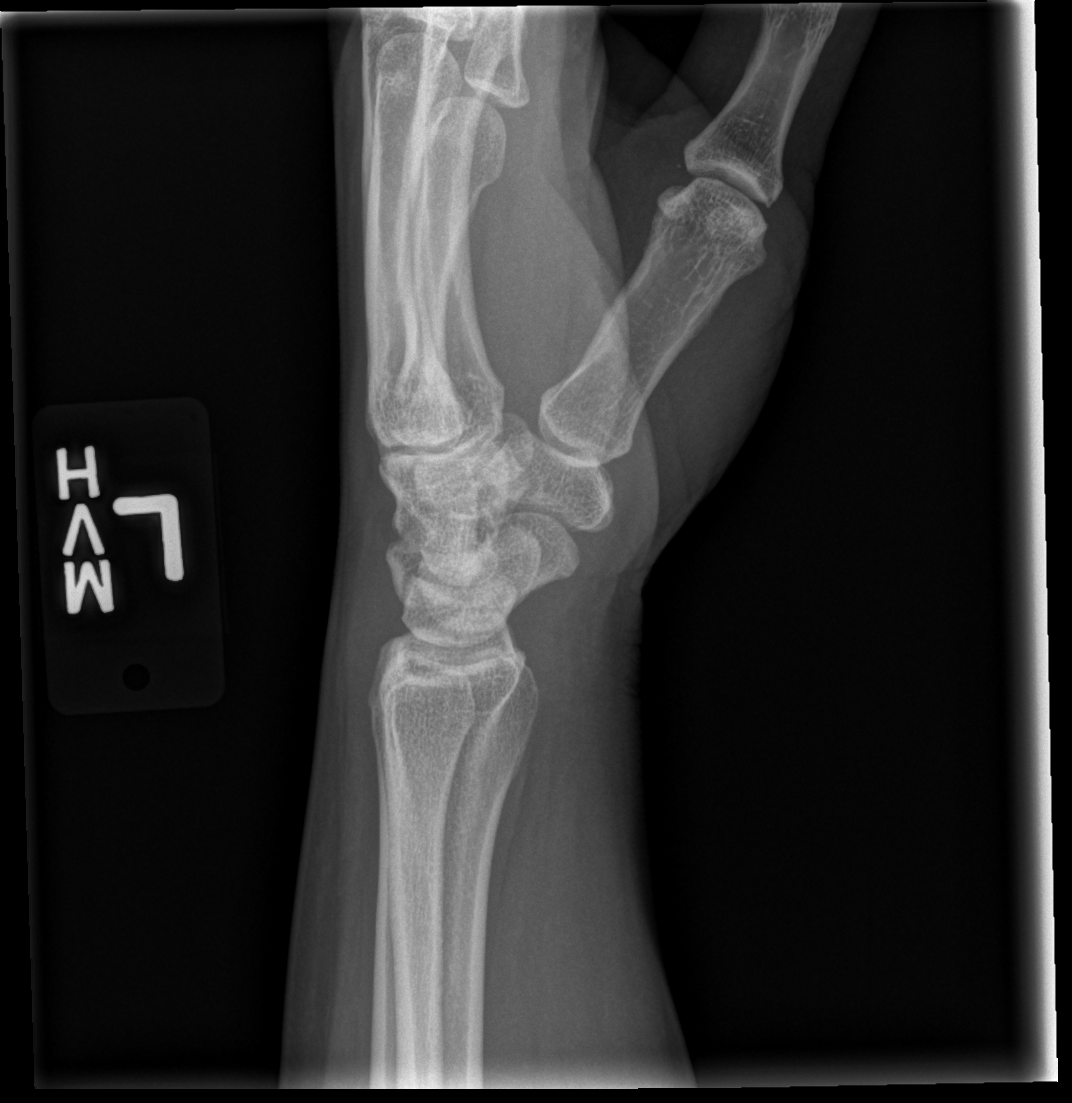

[x wrist navicular view left]
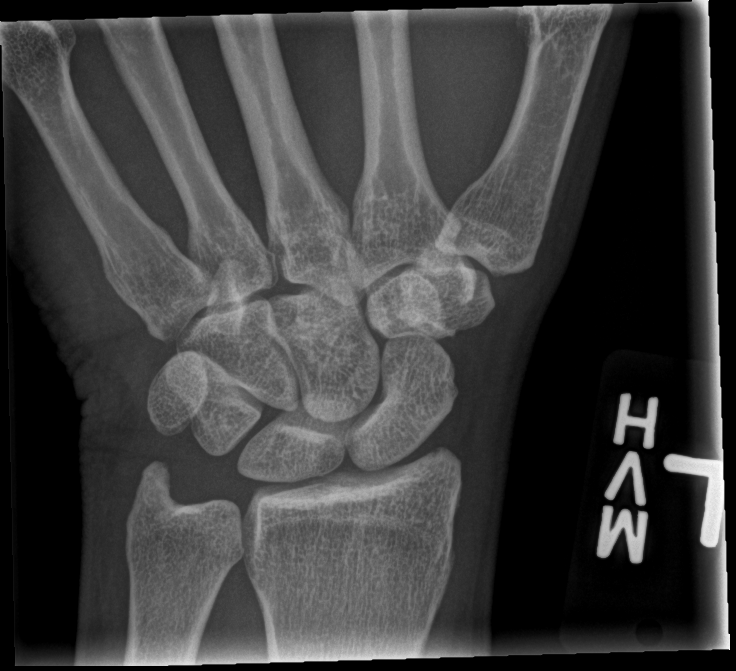

[4 of 4 positions shown; findings below may reference images not displayed]

FINDINGS: Four views of the left wrist submitted. No acute fracture or
subluxation. No periosteal reaction or bony erosion.
IMPRESSION: Negative.

## 2018-04-05 ENCOUNTER — Ambulatory Visit: Payer: PRIVATE HEALTH INSURANCE | Admitting: Family Medicine

## 2018-04-20 ENCOUNTER — Ambulatory Visit: Payer: PRIVATE HEALTH INSURANCE | Admitting: Family Medicine

## 2018-08-21 ENCOUNTER — Emergency Department (HOSPITAL_COMMUNITY)
Admission: EM | Admit: 2018-08-21 | Discharge: 2018-08-21 | Disposition: A | Discharge status: Home / Self Care | Payer: PRIVATE HEALTH INSURANCE | Attending: Emergency Medicine | Admitting: Emergency Medicine

## 2018-08-21 ENCOUNTER — Emergency Department (HOSPITAL_COMMUNITY): Payer: PRIVATE HEALTH INSURANCE

## 2018-08-21 ENCOUNTER — Encounter (HOSPITAL_COMMUNITY): Payer: Self-pay | Admitting: Emergency Medicine

## 2018-08-21 ENCOUNTER — Other Ambulatory Visit: Payer: Self-pay

## 2018-08-21 DIAGNOSIS — I10 Essential (primary) hypertension: Secondary | ICD-10-CM | POA: Insufficient documentation

## 2018-08-21 DIAGNOSIS — F17210 Nicotine dependence, cigarettes, uncomplicated: Secondary | ICD-10-CM | POA: Insufficient documentation

## 2018-08-21 DIAGNOSIS — Y999 Unspecified external cause status: Secondary | ICD-10-CM | POA: Insufficient documentation

## 2018-08-21 DIAGNOSIS — Y929 Unspecified place or not applicable: Secondary | ICD-10-CM | POA: Insufficient documentation

## 2018-08-21 DIAGNOSIS — Y9339 Activity, other involving climbing, rappelling and jumping off: Secondary | ICD-10-CM | POA: Insufficient documentation

## 2018-08-21 DIAGNOSIS — J45909 Unspecified asthma, uncomplicated: Secondary | ICD-10-CM | POA: Insufficient documentation

## 2018-08-21 DIAGNOSIS — W231XXA Caught, crushed, jammed, or pinched between stationary objects, initial encounter: Secondary | ICD-10-CM | POA: Insufficient documentation

## 2018-08-21 DIAGNOSIS — S63501A Unspecified sprain of right wrist, initial encounter: Secondary | ICD-10-CM | POA: Insufficient documentation

## 2018-08-21 DIAGNOSIS — Z79899 Other long term (current) drug therapy: Secondary | ICD-10-CM | POA: Insufficient documentation

## 2018-08-21 MED ORDER — ACETAMINOPHEN 500 MG PO TABS
1000.0000 mg | ORAL_TABLET | Freq: Once | ORAL | Status: AC
  Administered 2018-08-21: 1000 mg via ORAL
  Filled 2018-08-21: qty 2

## 2018-08-21 NOTE — Discharge Instructions (Signed)
Wear wrist brace for at least 2 weeks for stabilization of wrist. Ice and elevate wrist throughout the day, using ice pack for no more than 20 minutes every hour.  Alternate between tylenol and ibuprofen as needed for pain. Call hand specialist follow up today or tomorrow to schedule followup appointment for recheck of ongoing wrist pain in 1-2 weeks that can be canceled with a 24-48 hour notice if complete resolution of pain. Return to the ER for changes or worsening symptoms.

## 2018-08-21 NOTE — ED Provider Notes (Signed)
[facility]-EMERGENCY DEPT Provider Note   CSN: [id number] Arrival date & time: 08/21/18  1341     History   Chief Complaint Chief Complaint  Patient presents with  . Wrist Injury    HPI Katherine Ramirez is a 53 y.o. female with a PMHx of HTN and asthma, who presents to the ED with complaints of right wrist pain after an injury sustained 3 days ago.  Patient was climbing the fence to try to get her dog, and her arm got caught on the fence.  The next day she started having pain.  She describes this pain as 9/10 intermittent throbbing right wrist pain that radiates somewhat into the distal forearm, worse with movement or use of the wrist, and unrelieved with aspirin.  She reports associated swelling and bruising.  She denies any abrasions, numbness, tingling, focal weakness, or any other injuries or complaints at this time.  The history is provided by the patient and medical records. No language interpreter was used.  Wrist Injury    Past Medical History:  Diagnosis Date  . Asthma   . Hypertension     There are no active problems to display for this patient.   Past Surgical History:  Procedure Laterality Date  . APPENDECTOMY    . HERNIA REPAIR       OB History   No obstetric history on file.      Home Medications    Prior to Admission medications   Medication Sig Start Date End Date Taking? Authorizing Provider  citalopram (CELEXA) 20 MG tablet Take 20 mg by mouth daily.    [provider]  esomeprazole (NEXIUM) 40 MG capsule Take 40 mg by mouth daily before breakfast.    [provider]  ezetimibe (ZETIA) 10 MG tablet Take 10 mg by mouth daily.    [provider]  HYDROcodone-acetaminophen (NORCO/VICODIN) 5-325 MG per tablet Take 1 tablet by mouth every 6 (six) hours as needed. Patient not taking: Reported on 05/21/2016 05/26/13   Sciacca, Ashok Cordia, PA-C  traMADol (ULTRAM) 50 MG tablet Take 1 tablet (50 mg total) by mouth  every 6 (six) hours as needed for pain. 11/19/12   Bethann Berkshire, MD    Family History Family History  Problem Relation Age of Onset  . Hypertension Mother   . Heart failure Father     Social History Social History   Tobacco Use  . Smoking status: Current Every Day Smoker    Packs/day: 0.50    Types: Cigarettes  . Smokeless tobacco: Never Used  Substance Use Topics  . Alcohol use: Yes  . Drug use: No     Allergies   Patient has no known allergies.   Review of Systems Review of Systems  Musculoskeletal: Positive for arthralgias and joint swelling.  Skin: Positive for color change. Negative for wound.  Allergic/Immunologic: Negative for immunocompromised state.  Neurological: Negative for weakness and numbness.     Physical Exam Updated Vital Signs BP 140/78   Pulse (!) 57   Temp 98 F (36.7 C) (Oral)   Resp 18   SpO2 100%   Physical Exam Vitals signs and nursing note reviewed.  Constitutional:      General: She is not in acute distress.    Appearance: Normal appearance. She is well-developed. She is not toxic-appearing.     Comments: Afebrile, nontoxic, NAD  HENT:     Head: Normocephalic and atraumatic.  Eyes:     General:  Right eye: No discharge.        Left eye: No discharge.     Conjunctiva/sclera: Conjunctivae normal.  Neck:     Musculoskeletal: Normal range of motion and neck supple.  Cardiovascular:     Rate and Rhythm: Normal rate.     Pulses: Normal pulses.  Pulmonary:     Effort: Pulmonary effort is normal. No respiratory distress.  Abdominal:     General: There is no distension.  Musculoskeletal: Normal range of motion.     Right wrist: She exhibits tenderness, bony tenderness and swelling. She exhibits normal range of motion, no crepitus, no deformity and no laceration.     Comments: R wrist with FROM intact, with mild TTP diffusely across wrist, no crepitus or deformity, no anatomical snuffbox tenderness, very mild swelling but no  bruising, no erythema or warmth. Strength and sensation grossly intact, distal pulses intact, compartments soft.   Skin:    General: Skin is warm and dry.     Findings: No rash.  Neurological:     Mental Status: She is alert and oriented to person, place, and time.     Sensory: Sensation is intact. No sensory deficit.     Motor: Motor function is intact.  Psychiatric:        Mood and Affect: Mood and affect normal.        Behavior: Behavior normal.      ED Treatments / Results  Labs (all labs ordered are listed, but only abnormal results are displayed) Labs Reviewed - No data to display  EKG None  Radiology Dg Wrist Complete Right  Result Date: 08/21/2018 CLINICAL DATA:  RIGHT wrist pain.  Evaluate for injury. EXAM: RIGHT WRIST - COMPLETE 3+ VIEW COMPARISON:  None. FINDINGS: There is no evidence of fracture or dislocation. There is no evidence of arthropathy or other focal bone abnormality. Soft tissues are unremarkable. IMPRESSION: Negative. Electronically Signed   By: Norva Pavlov M.D.   On: 08/21/2018 14:20    Procedures Procedures (including critical care time)  Medications Ordered in ED Medications  acetaminophen (TYLENOL) tablet 1,000 mg (1,000 mg Oral Given 08/21/18 1424)     Initial Impression / Assessment and Plan / ED Course  I have reviewed the triage vital signs and the nursing notes.  Pertinent labs & imaging results that were available during my care of the patient were reviewed by me and considered in my medical decision making (see chart for details).     53 y.o. female here with right wrist pain after her arm got caught on a fence while she was trying to climb the fence to get her dog.  On exam, mild diffuse wrist tenderness, no anatomical snuffbox tenderness, no hand or forearm tenderness, no crepitus or deformity, very mild swelling, no bruising.  Neurovascularly intact with soft compartments.  Will obtain x-ray, give Tylenol, and reassess  shortly.  3:46 PM Xray negative for fx. Likely sprain. Will place in velcro wrist splint for comfort/protection, advised RICE/tylenol/motrin for pain, f/up with hand in 1-2wks for recheck. I explained the diagnosis and have given explicit precautions to return to the ER including for any other new or worsening symptoms. The patient understands and accepts the medical plan as it's been dictated and I have answered their questions. Discharge instructions concerning home care and prescriptions have been given. The patient is STABLE and is discharged to home in good condition.    Final Clinical Impressions(s) / ED Diagnoses   Final diagnoses:  Sprain of right wrist, initial encounter    ED Discharge Orders    [address]None       Jammi Morrissette, BlufordMercedes, [place] JerseyPA-C 08/21/18 1546    Arby BarrettePfeiffer, Marcy, MD 08/22/18 [phone]

## 2018-08-21 NOTE — ED Notes (Signed)
Pt to xray

## 2018-08-21 NOTE — ED Triage Notes (Signed)
Pt states she hurt her right wrist 3 days ago climbing over a fence. And it started to get really painful yesterday

## 2020-07-24 ENCOUNTER — Other Ambulatory Visit: Payer: Self-pay

## 2020-07-24 ENCOUNTER — Other Ambulatory Visit: Payer: Medicaid Other

## 2020-07-24 DIAGNOSIS — Z20822 Contact with and (suspected) exposure to covid-19: Secondary | ICD-10-CM | POA: Diagnosis not present

## 2020-07-29 LAB — NOVEL CORONAVIRUS, NAA: SARS-CoV-2, NAA: NOT DETECTED

## 2020-07-30 ENCOUNTER — Encounter (HOSPITAL_COMMUNITY): Payer: Self-pay | Admitting: Emergency Medicine

## 2020-11-14 DIAGNOSIS — N6452 Nipple discharge: Secondary | ICD-10-CM | POA: Diagnosis not present

## 2020-11-14 DIAGNOSIS — Z113 Encounter for screening for infections with a predominantly sexual mode of transmission: Secondary | ICD-10-CM | POA: Diagnosis not present

## 2020-11-14 DIAGNOSIS — Z3009 Encounter for other general counseling and advice on contraception: Secondary | ICD-10-CM | POA: Diagnosis not present

## 2021-03-11 ENCOUNTER — Telehealth: Payer: Self-pay

## 2021-03-11 NOTE — Telephone Encounter (Signed)
Telephoned patient at home number. Left a voice message with [facility] contact information. 

## 2021-09-17 ENCOUNTER — Emergency Department (HOSPITAL_COMMUNITY)
Admission: EM | Admit: 2021-09-17 | Discharge: 2021-09-17 | Disposition: A | Discharge status: Home / Self Care | Payer: Medicaid Other | Attending: Emergency Medicine | Admitting: Emergency Medicine

## 2021-09-17 ENCOUNTER — Encounter (HOSPITAL_COMMUNITY): Payer: Self-pay

## 2021-09-17 ENCOUNTER — Other Ambulatory Visit: Payer: Self-pay

## 2021-09-17 ENCOUNTER — Emergency Department (HOSPITAL_COMMUNITY): Payer: Medicaid Other

## 2021-09-17 DIAGNOSIS — Z23 Encounter for immunization: Secondary | ICD-10-CM | POA: Insufficient documentation

## 2021-09-17 DIAGNOSIS — M25512 Pain in left shoulder: Secondary | ICD-10-CM | POA: Insufficient documentation

## 2021-09-17 HISTORY — DX: Bipolar disorder, unspecified: F31.9

## 2021-09-17 MED ORDER — DICLOFENAC SODIUM 50 MG PO TBEC
50.0000 mg | DELAYED_RELEASE_TABLET | Freq: Two times a day (BID) | ORAL | 0 refills | Status: AC
Start: 2021-09-17 — End: 2021-09-27

## 2021-09-17 MED ORDER — METHOCARBAMOL 500 MG PO TABS: 500.0000 mg | ORAL_TABLET | Freq: Two times a day (BID) | ORAL | 0 refills | Status: DC

## 2021-09-17 MED ORDER — TETANUS-DIPHTH-ACELL PERTUSSIS 5-2.5-18.5 LF-MCG/0.5 IM SUSY
0.5000 mL | PREFILLED_SYRINGE | Freq: Once | INTRAMUSCULAR | Status: AC
  Administered 2021-09-17: 0.5 mL via INTRAMUSCULAR
  Filled 2021-09-17: qty 0.5

## 2021-09-17 NOTE — Discharge Instructions (Signed)
Apply warm compress to your shoulder for 20 minutes followed by gentle stretching, see discharge instructions. ?Take Robaxin and diclofenac as needed as prescribed for your pain.  Follow-up with orthopedics if not improving after 10 days. ?
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
## 2021-10-02 ENCOUNTER — Encounter (HOSPITAL_COMMUNITY): Payer: Self-pay

## 2021-10-02 ENCOUNTER — Ambulatory Visit (HOSPITAL_COMMUNITY)
Admission: EM | Admit: 2021-10-02 | Discharge: 2021-10-02 | Disposition: A | Discharge status: Home / Self Care | Payer: Medicaid Other | Attending: Family Medicine | Admitting: Family Medicine

## 2021-10-02 DIAGNOSIS — R110 Nausea: Secondary | ICD-10-CM

## 2021-10-02 DIAGNOSIS — R197 Diarrhea, unspecified: Secondary | ICD-10-CM

## 2021-10-02 LAB — POCT URINALYSIS DIPSTICK, ED / UC
Glucose, UA: NEGATIVE mg/dL
Hgb urine dipstick: NEGATIVE
Leukocytes,Ua: NEGATIVE
Nitrite: NEGATIVE
Protein, ur: NEGATIVE mg/dL
Specific Gravity, Urine: >=1.03 (ref 1.005–1.030)
Urobilinogen, UA: 0.2 mg/dL (ref 0.0–1.0)
pH: 5.5 (ref 5.0–8.0)

## 2021-10-02 MED ORDER — ONDANSETRON 4 MG PO TBDP: 4.0000 mg | ORAL_TABLET | Freq: Three times a day (TID) | ORAL | 0 refills | Status: DC | PRN

## 2021-10-02 NOTE — Discharge Instructions (Addendum)
Please do your best to ensure adequate fluid intake in order to avoid dehydration. If you find that you are unable to tolerate drinking fluids regularly please proceed to the Emergency Department for evaluation. ° ° °

## 2021-10-02 NOTE — ED Triage Notes (Signed)
Pt presents today with headache, diarrhea, nausea, and body aches. Pt took cough medicine and ibuprofen today.  ?

## 2021-10-05 NOTE — ED Provider Notes (Signed)
?[facility] ? ? ?[id number] ?10/02/21 Arrival Time: 1632 ? ?ASSESSMENT & PLAN: ? ?1. Diarrhea, unspecified type   ?2. Nausea   ?No signs of dehydration req IVF. Tolerating by mouth. ? ?Meds ordered this encounter  ?Medications  ? ondansetron (ZOFRAN-ODT) 4 MG disintegrating tablet  ?  Sig: Take 1 tablet (4 mg total) by mouth every 8 (eight) hours as needed for nausea or vomiting.  ?  Dispense:  15 tablet  ?  Refill:  0  ? ?U/A without signs of infection. ? ?Discussed typical duration of symptoms for suspected viral GI illness. ?Will do her best to ensure adequate fluid intake in order to avoid dehydration. ?Will proceed to the Emergency Department for evaluation if unable to tolerate PO fluids regularly. ? ?Otherwise she will f/u with her PCP or here if not showing improvement over the next 48-72 hours. ? ?Reviewed expectations re: course of current medical issues. Questions answered. ?Outlined signs and symptoms indicating need for more acute intervention. ?Patient verbalized understanding. ?After Visit Summary given. ? ? ?SUBJECTIVE: ?History from: patient. ? ?Katherine Ramirez is a 56 y.o. female who presents with complaint of non-bilious, non-bloody intermittent n/v with non-bloody diarrhea. Onset today. Abdominal discomfort: mild and cramping. Symptoms are unchanged since beginning. Aggravating factors: eating. Alleviating factors: none. Associated symptoms: fatigue. She denies chills and fever. Appetite: decreased. PO intake: decreased. Ambulatory without assistance. Urinary symptoms: none. ?Sick contacts: none. ?Recent travel or camping: none. ?OTC treatment: none. ? ?Patient's last menstrual period was 03/20/2016 (exact date). ? ?Past Surgical History:  ?Procedure Laterality Date  ? APPENDECTOMY    ? HERNIA REPAIR    ? ? ?OBJECTIVE: ? ?Vitals:  ? 10/02/21 1713  ?BP: 125/88  ?Pulse: 73  ?Resp: 18  ?Temp: 99.7 ?F (37.6 ?C)  ?TempSrc: Oral  ?SpO2: 97%  ?  ?General appearance: alert; no  distress ?Oropharynx: moist ?Lungs: clear to auscultation bilaterally; unlabored ?Heart: regular rate and rhythm ?Abdomen: soft; non-distended; no significant abdominal tenderness; reports "cramping" feeling; bowel sounds present; no masses or organomegaly; no guarding or rebound tenderness ?Back: no CVA tenderness ?Extremities: no edema; symmetrical with no gross deformities ?Skin: warm; dry ?Neurologic: normal gait ?Psychological: alert and cooperative; normal mood and affect ? ?Labs: ?Results for orders placed or performed during the hospital encounter of 10/02/21  ?POC Urinalysis dipstick  ?Result Value Ref Range  ? Glucose, UA NEGATIVE NEGATIVE mg/dL  ? Bilirubin Urine SMALL (A) NEGATIVE  ? Ketones, ur TRACE (A) NEGATIVE mg/dL  ? Specific Gravity, Urine >=1.030 1.005 - 1.030  ? Hgb urine dipstick NEGATIVE NEGATIVE  ? pH 5.5 5.0 - 8.0  ? Protein, ur NEGATIVE NEGATIVE mg/dL  ? Urobilinogen, UA 0.2 0.0 - 1.0 mg/dL  ? Nitrite NEGATIVE NEGATIVE  ? Leukocytes,Ua NEGATIVE NEGATIVE  ? ?Labs Reviewed  ?POCT URINALYSIS DIPSTICK, ED / UC - Abnormal; Notable for the following components:  ?    Result Value  ? Bilirubin Urine SMALL (*)   ? Ketones, ur TRACE (*)   ? All other components within normal limits  ? ? ? ? ?No Known Allergies ?                                            ?Past Medical History:  ?Diagnosis Date  ? Asthma   ? Bipolar 1 disorder (HCC)   ? Hypertension   ? ?Social History  ? ?  Socioeconomic History  ? Marital status: Single  ?  Spouse name: Not on file  ? Number of children: Not on file  ? Years of education: Not on file  ? Highest education level: Not on file  ?Occupational History  ? Not on file  ?Tobacco Use  ? Smoking status: Every Day  ?  Packs/day: 0.50  ?  Types: Cigarettes  ? Smokeless tobacco: Never  ?Vaping Use  ? Vaping Use: Never used  ?Substance and Sexual Activity  ? Alcohol use: Yes  ? Drug use: No  ? Sexual activity: Yes  ?  Birth control/protection: Condom, I.U.D.  ?Other Topics Concern   ? Not on file  ?Social History Narrative  ? ** Merged History Encounter **  ?    ? ?Social Determinants of Health  ? ?Financial Resource Strain: Not on file  ?Food Insecurity: Not on file  ?Transportation Needs: Not on file  ?Physical Activity: Not on file  ?Stress: Not on file  ?Social Connections: Not on file  ?Intimate Partner Violence: Not on file  ? ?Family History  ?Problem Relation Age of Onset  ? Hypertension Mother   ? Heart failure Father   ? ? ?  ?Mardella Layman, MD ?10/05/21 [phone] ? ?

## 2021-10-25 DIAGNOSIS — Z3009 Encounter for other general counseling and advice on contraception: Secondary | ICD-10-CM | POA: Diagnosis not present

## 2021-10-25 DIAGNOSIS — Z0389 Encounter for observation for other suspected diseases and conditions ruled out: Secondary | ICD-10-CM | POA: Diagnosis not present

## 2021-10-25 DIAGNOSIS — Z1388 Encounter for screening for disorder due to exposure to contaminants: Secondary | ICD-10-CM | POA: Diagnosis not present

## 2022-07-29 ENCOUNTER — Ambulatory Visit (INDEPENDENT_AMBULATORY_CARE_PROVIDER_SITE_OTHER): Payer: Commercial Managed Care - HMO | Admitting: Family Medicine

## 2022-07-29 ENCOUNTER — Encounter: Payer: Self-pay | Admitting: Family Medicine

## 2022-07-29 VITALS — BP 126/88 | HR 79 | Temp 97.5°F | Ht 62.75 in | Wt 153.6 lb

## 2022-07-29 DIAGNOSIS — M7731 Calcaneal spur, right foot: Secondary | ICD-10-CM | POA: Diagnosis not present

## 2022-07-29 DIAGNOSIS — M25512 Pain in left shoulder: Secondary | ICD-10-CM | POA: Diagnosis not present

## 2022-07-29 DIAGNOSIS — G8929 Other chronic pain: Secondary | ICD-10-CM | POA: Insufficient documentation

## 2022-07-29 DIAGNOSIS — H5712 Ocular pain, left eye: Secondary | ICD-10-CM

## 2022-07-29 DIAGNOSIS — H10402 Unspecified chronic conjunctivitis, left eye: Secondary | ICD-10-CM | POA: Insufficient documentation

## 2022-07-29 MED ORDER — METHOCARBAMOL 500 MG PO TABS: 500.0000 mg | ORAL_TABLET | Freq: Two times a day (BID) | ORAL | 0 refills | Status: DC

## 2022-07-29 MED ORDER — DICLOFENAC SODIUM 75 MG PO TBEC: 75.0000 mg | DELAYED_RELEASE_TABLET | Freq: Two times a day (BID) | ORAL | 0 refills | Status: DC

## 2022-07-29 MED ORDER — DICLOFENAC SODIUM 1 % EX GEL
4.0000 g | Freq: Four times a day (QID) | CUTANEOUS | 3 refills | Status: DC | PRN
Start: 2022-07-29 — End: 2022-12-11

## 2022-07-29 NOTE — Patient Instructions (Signed)
For xray, go to:    [facility], [address], [place], [place] [address] Phone: [phone]
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
## 2022-07-29 NOTE — Assessment & Plan Note (Signed)
The neck and shoulder pain's etiology could be musculoskeletal, related to the patient's previous injury and occupational activities.  Differential diagnosis: - Soft tissue strain or sprain - Cervical radiculopathy - Rotator cuff tendinopathy or tear - Osteoarthritis (less likely given normal X-ray in March 2023)  Plan: - Obtain cervical spine and left shoulder x-rays to evaluate for any skeletal abnormalities. - Prescribe Diclofenac 75 mg EC tablet for anti-inflammatory effect and pain control. - Prescribe Methocarbamol 500 mg tablet for muscle spasm relief. - Provide a referral to orthopedics for further evaluation of shoulder pain and a history of chronic foot pain potentially related to heel spurs. - Encourage the application of ice to the affected area and stretching exercises. - Review and follow up on results of imaging and assess the effectiveness of medication during a follow-up visit.

## 2022-08-05 ENCOUNTER — Ambulatory Visit (INDEPENDENT_AMBULATORY_CARE_PROVIDER_SITE_OTHER): Payer: Commercial Managed Care - HMO | Admitting: Family

## 2022-08-05 ENCOUNTER — Ambulatory Visit (INDEPENDENT_AMBULATORY_CARE_PROVIDER_SITE_OTHER): Payer: Commercial Managed Care - HMO

## 2022-08-05 DIAGNOSIS — M79672 Pain in left foot: Secondary | ICD-10-CM

## 2022-08-05 DIAGNOSIS — B351 Tinea unguium: Secondary | ICD-10-CM | POA: Diagnosis not present

## 2022-08-05 DIAGNOSIS — M722 Plantar fascial fibromatosis: Secondary | ICD-10-CM | POA: Diagnosis not present

## 2022-08-05 DIAGNOSIS — M79671 Pain in right foot: Secondary | ICD-10-CM

## 2022-08-05 MED ORDER — LIDOCAINE HCL 1 % IJ SOLN
2.0000 mL | INTRAMUSCULAR | Status: AC | PRN
  Administered 2022-08-05: 2 mL

## 2022-08-05 MED ORDER — METHYLPREDNISOLONE ACETATE 40 MG/ML IJ SUSP
40.0000 mg | INTRAMUSCULAR | Status: AC | PRN
  Administered 2022-08-05: 40 mg

## 2022-08-05 NOTE — Progress Notes (Signed)
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  She is having pain right beneath her heel she has tried Voltaren gel and Robaxin with minimal relief  Does have a history of plantars fasciitis on the right only did have a cortisone injection about 5 years ago which was useful to her  She has had issues with her right heel for months now the left heel is beginning to bother her  Complains of discolored and thickened toenails she is concerned this might be fungal has had a hard time getting this to resolve  Assessment & Plan: Visit Diagnoses:  1. Bilateral foot pain     Plan: Depo-Medrol injection right heel.  Patient tolerated well she will follow-up in the office as needed discussed using naproxen twice daily for the next 2 weeks for the left heel discussed supportive shoewear recommended sole orthotics  Follow-Up Instructions: No follow-ups on file.   Ortho Exam  Patient is alert, oriented, no adenopathy, well-dressed, normal affect, normal respiratory effort. On examination bilateral feet are plantigrade she does have palpable dorsalis pedis pulses.  Tender to origin of plantar fascia bilaterally, right worse than left. No pain with lateral compression of calcaneus. Dorsiflexion past neutral  Thickened and discolored onychomycotic toenails x 8  Imaging: No  results found. No images are attached to the encounter.  Labs: No results found for: "HGBA1C", "ESRSEDRATE", "CRP", "LABURIC", "REPTSTATUS", "GRAMSTAIN", "CULT", "LABORGA"   Lab Results  Component Value Date   ALBUMIN 3.6 11/19/2012    No results found for: "MG" No results found for: "VD25OH"  No results found for: "PREALBUMIN"    Latest Ref Rng & Units 11/19/2012    2:52 PM  CBC EXTENDED  WBC 4.0 - 10.5 K/uL 7.1   RBC 3.87 - 5.11 MIL/uL 4.24   Hemoglobin 12.0 - 15.0 g/dL 14.0   HCT 36.0 - 46.0 % 41.3   Platelets 150 - 400 K/uL 252   NEUT# 1.7 - 7.7 K/uL 4.9   Lymph# 0.7 - 4.0 K/uL 1.3      There is no height or weight on file to calculate BMI.  Orders:  Orders Placed This Encounter  Procedures   XR Foot Complete Right   XR Foot Complete Left   Hepatic function panel   No orders of the defined types were placed in this encounter.    Procedures: Foot Inj  Date/Time: 08/05/2022 10:05 AM  Performed by: Suzan Slick, NP Authorized by: Suzan Slick, NP   Consent Given by:  Patient Site marked: the procedure site was marked   Timeout: prior to procedure the correct patient, procedure, and site was  verified   Indications:  Fasciitis and pain Condition: Plantar Fasciitis   Location: right plantar fascia muscle   Prep: patient was prepped and draped in usual sterile fashion   Needle Size:  22 G Medications:  2 mL lidocaine 1 %; 40 mg methylPREDNISolone acetate 40 MG/ML Patient Tolerance:  Patient tolerated the procedure well with no immediate complications   Clinical Data: No additional findings.  ROS:  All other systems negative, except as noted in the HPI. Review of Systems  Objective: Vital Signs: LMP 03/20/2016 (Exact Date)   Specialty Comments:  No specialty comments available.  PMFS History: Patient Active Problem List   Diagnosis Date Noted   Left eye pain 07/29/2022   Acute pain of left shoulder 07/29/2022   Past Medical History:   Diagnosis Date   Asthma    Bipolar 1 disorder ([place])    Hypertension     Family History  Problem Relation Age of Onset   Hypertension Mother    Heart failure Father     Past Surgical History:  Procedure Laterality Date   APPENDECTOMY     HERNIA REPAIR     Social History   Occupational History   Not on file  Tobacco Use   Smoking status: Every Day    Packs/day: 0.50    Types: Cigarettes    Passive exposure: Never   Smokeless tobacco: Never  Vaping Use   Vaping Use: Never used  Substance and Sexual Activity   Alcohol use: Yes    Comment: Occasionally   Drug use: No   Sexual activity: Yes    Birth control/protection: Condom, I.U.D.

## 2022-08-06 ENCOUNTER — Encounter: Payer: Self-pay | Admitting: Family

## 2022-08-06 LAB — HEPATIC FUNCTION PANEL
AG Ratio: 1.4 (calc) (ref 1.0–2.5)
ALT: 15 U/L (ref 6–29)
AST: 16 U/L (ref 10–35)
Albumin: 4.2 g/dL (ref 3.6–5.1)
Alkaline phosphatase (APISO): 78 U/L (ref 37–153)
Bilirubin, Direct: 0.3 mg/dL — ABNORMAL HIGH (ref 0.0–0.2)
Globulin: 3 g/dL (calc) (ref 1.9–3.7)
Indirect Bilirubin: 1.2 mg/dL (calc) (ref 0.2–1.2)
Total Bilirubin: 1.5 mg/dL — ABNORMAL HIGH (ref 0.2–1.2)
Total Protein: 7.2 g/dL (ref 6.1–8.1)

## 2022-08-06 MED ORDER — TERBINAFINE HCL 250 MG PO TABS: 250.0000 mg | ORAL_TABLET | Freq: Every day | ORAL | 0 refills | Status: AC

## 2022-08-06 NOTE — Addendum Note (Signed)
Addended by: Suzan Slick on: 08/06/2022 03:17 PM   Modules accepted: Orders

## 2022-10-28 ENCOUNTER — Encounter: Payer: Self-pay | Admitting: Family Medicine

## 2022-10-28 ENCOUNTER — Ambulatory Visit: Payer: Commercial Managed Care - HMO | Admitting: Family Medicine

## 2022-10-28 VITALS — BP 134/82 | HR 57 | Temp 97.8°F | Ht 62.75 in | Wt 155.2 lb

## 2022-10-28 DIAGNOSIS — R17 Unspecified jaundice: Secondary | ICD-10-CM

## 2022-10-28 DIAGNOSIS — Z0000 Encounter for general adult medical examination without abnormal findings: Secondary | ICD-10-CM

## 2022-10-28 DIAGNOSIS — Z13220 Encounter for screening for lipoid disorders: Secondary | ICD-10-CM | POA: Diagnosis not present

## 2022-10-28 DIAGNOSIS — Z758 Other problems related to medical facilities and other health care: Secondary | ICD-10-CM

## 2022-10-28 DIAGNOSIS — R110 Nausea: Secondary | ICD-10-CM

## 2022-10-28 DIAGNOSIS — Z1211 Encounter for screening for malignant neoplasm of colon: Secondary | ICD-10-CM | POA: Diagnosis not present

## 2022-10-28 DIAGNOSIS — Z1231 Encounter for screening mammogram for malignant neoplasm of breast: Secondary | ICD-10-CM

## 2022-10-28 DIAGNOSIS — G8929 Other chronic pain: Secondary | ICD-10-CM

## 2022-10-28 DIAGNOSIS — Z124 Encounter for screening for malignant neoplasm of cervix: Secondary | ICD-10-CM

## 2022-10-28 DIAGNOSIS — M7731 Calcaneal spur, right foot: Secondary | ICD-10-CM

## 2022-10-28 DIAGNOSIS — H10402 Unspecified chronic conjunctivitis, left eye: Secondary | ICD-10-CM

## 2022-10-28 DIAGNOSIS — N951 Menopausal and female climacteric states: Secondary | ICD-10-CM

## 2022-10-28 DIAGNOSIS — F17200 Nicotine dependence, unspecified, uncomplicated: Secondary | ICD-10-CM

## 2022-10-28 DIAGNOSIS — Z1159 Encounter for screening for other viral diseases: Secondary | ICD-10-CM

## 2022-10-28 DIAGNOSIS — M25512 Pain in left shoulder: Secondary | ICD-10-CM

## 2022-10-28 DIAGNOSIS — Z8719 Personal history of other diseases of the digestive system: Secondary | ICD-10-CM

## 2022-10-28 DIAGNOSIS — Z9889 Other specified postprocedural states: Secondary | ICD-10-CM

## 2022-10-28 LAB — COMPREHENSIVE METABOLIC PANEL
ALT: 17 U/L (ref 0–35)
AST: 20 U/L (ref 0–37)
Albumin: 4.2 g/dL (ref 3.5–5.2)
Alkaline Phosphatase: 74 U/L (ref 39–117)
BUN: 10 mg/dL (ref 6–23)
CO2: 29 mEq/L (ref 19–32)
Calcium: 9.5 mg/dL (ref 8.4–10.5)
Chloride: 102 mEq/L (ref 96–112)
Creatinine, Ser: 0.71 mg/dL (ref 0.40–1.20)
GFR: 95.97 mL/min (ref >60.00)
Glucose, Bld: 81 mg/dL (ref 70–99)
Potassium: 3.9 mEq/L (ref 3.5–5.1)
Sodium: 139 mEq/L (ref 135–145)
Total Bilirubin: 1 mg/dL (ref 0.2–1.2)
Total Protein: 6.9 g/dL (ref 6.0–8.3)

## 2022-10-28 LAB — CBC WITH DIFFERENTIAL/PLATELET
Basophils Absolute: 0 10*3/uL (ref 0.0–0.1)
Basophils Relative: 0.4 % (ref 0.0–3.0)
Eosinophils Absolute: 0.1 10*3/uL (ref 0.0–0.7)
Eosinophils Relative: 2.6 % (ref 0.0–5.0)
HCT: 37.6 % (ref 36.0–46.0)
Hemoglobin: 12.8 g/dL (ref 12.0–15.0)
Lymphocytes Relative: 43.2 % (ref 12.0–46.0)
Lymphs Abs: 1.9 10*3/uL (ref 0.7–4.0)
MCHC: 34 g/dL (ref 30.0–36.0)
MCV: 97.4 fl (ref 78.0–100.0)
Monocytes Absolute: 0.4 10*3/uL (ref 0.1–1.0)
Monocytes Relative: 8.8 % (ref 3.0–12.0)
Neutro Abs: 2 10*3/uL (ref 1.4–7.7)
Neutrophils Relative %: 45 % (ref 43.0–77.0)
Platelets: 302 10*3/uL (ref 150.0–400.0)
RBC: 3.86 Mil/uL — ABNORMAL LOW (ref 3.87–5.11)
RDW: 13.5 % (ref 11.5–15.5)
WBC: 4.4 10*3/uL (ref 4.0–10.5)

## 2022-10-28 LAB — URINALYSIS, ROUTINE W REFLEX MICROSCOPIC
Bilirubin Urine: NEGATIVE
Hgb urine dipstick: NEGATIVE
Ketones, ur: NEGATIVE
Leukocytes,Ua: NEGATIVE
Nitrite: NEGATIVE
Specific Gravity, Urine: 1.015 (ref 1.000–1.030)
Total Protein, Urine: NEGATIVE
Urine Glucose: NEGATIVE
Urobilinogen, UA: 0.2 (ref 0.0–1.0)
pH: 6.5 (ref 5.0–8.0)

## 2022-10-28 LAB — TSH: TSH: 2.73 u[IU]/mL (ref 0.35–5.50)

## 2022-10-28 LAB — APTT: aPTT: 34.7 s (ref 25.4–36.8)

## 2022-10-28 LAB — C-REACTIVE PROTEIN: CRP: <1 mg/dL (ref 0.5–20.0)

## 2022-10-28 LAB — LIPID PANEL
Cholesterol: 199 mg/dL (ref 0–200)
HDL: 73.4 mg/dL (ref >39.00)
LDL Cholesterol: 108 mg/dL — ABNORMAL HIGH (ref 0–99)
NonHDL: 125.53
Total CHOL/HDL Ratio: 3
Triglycerides: 87 mg/dL (ref 0.0–149.0)
VLDL: 17.4 mg/dL (ref 0.0–40.0)

## 2022-10-28 LAB — HEMOGLOBIN A1C: Hgb A1c MFr Bld: 5 % (ref 4.6–6.5)

## 2022-10-28 LAB — PROTIME-INR
INR: 1 ratio (ref 0.8–1.0)
Prothrombin Time: 11.1 s (ref 9.6–13.1)

## 2022-10-28 LAB — AMYLASE: Amylase: 53 U/L (ref 27–131)

## 2022-10-28 LAB — LIPASE: Lipase: 62 U/L — ABNORMAL HIGH (ref 11.0–59.0)

## 2022-10-28 LAB — SEDIMENTATION RATE: Sed Rate: 21 mm/hr (ref 0–30)

## 2022-10-28 MED ORDER — VARENICLINE TARTRATE (STARTER) 0.5 MG X 11 & 1 MG X 42 PO TBPK: ORAL_TABLET | ORAL | 0 refills | Status: DC

## 2022-10-28 MED ORDER — DICLOFENAC SODIUM 75 MG PO TBEC: 75.0000 mg | DELAYED_RELEASE_TABLET | Freq: Two times a day (BID) | ORAL | 0 refills | Status: DC

## 2022-10-28 MED ORDER — METHOCARBAMOL 500 MG PO TABS: 500.0000 mg | ORAL_TABLET | Freq: Two times a day (BID) | ORAL | 0 refills | Status: DC

## 2022-10-28 MED ORDER — CIPROFLOXACIN HCL 0.3 % OP SOLN: OPHTHALMIC | 0 refills | Status: DC
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
## 2022-10-28 NOTE — Patient Instructions (Addendum)
For smoking, start Chantix.  For hot flashes and Pap smear, will refer to OB/GYN.  For shoulder pain, we have referred to orthopedics and refilled Robaxin and diclofenac.  For prevention of cardiac disease, we are getting blood work as discussed.  For nausea and elevated bilirubin, we are ordering an ultrasound and refer to gastroenterology getting blood work as discussed.  For eye, try ciprofloxacin eyedrops and follow-up with ophthalmology information provided below.  [facility]  [address] [place], [place] [address]  Mon.-Fri.            8:30am-5:00pm  [phone]    Mobile Mammogram   Scheduled 11/03/2022 at 11:00 am

## 2022-10-30 ENCOUNTER — Telehealth: Payer: Self-pay

## 2022-10-30 NOTE — Telephone Encounter (Signed)
Received fax from covermymeds requesting PA for diclofenac sodium  dr tablets. Please advise

## 2022-10-31 DIAGNOSIS — R110 Nausea: Secondary | ICD-10-CM | POA: Insufficient documentation

## 2022-10-31 DIAGNOSIS — R17 Unspecified jaundice: Secondary | ICD-10-CM | POA: Insufficient documentation

## 2022-10-31 DIAGNOSIS — F17200 Nicotine dependence, unspecified, uncomplicated: Secondary | ICD-10-CM | POA: Insufficient documentation

## 2022-10-31 DIAGNOSIS — N951 Menopausal and female climacteric states: Secondary | ICD-10-CM | POA: Insufficient documentation

## 2022-10-31 DIAGNOSIS — M7731 Calcaneal spur, right foot: Secondary | ICD-10-CM | POA: Insufficient documentation

## 2022-10-31 HISTORY — DX: Unspecified jaundice: R17
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
## 2022-10-31 NOTE — Assessment & Plan Note (Signed)
Continue current treatment with diclofenac and methocarbamol; evaluate effectiveness and consider physical therapy referral.

## 2022-10-31 NOTE — Assessment & Plan Note (Signed)
Symptoms consistent with menopause. OBGYN referral for hormone therapy and management of vasomotor symptoms. Consideration of lifestyle interventions.

## 2022-10-31 NOTE — Assessment & Plan Note (Signed)
Comprehensive workup including liver function tests and imaging via ultrasound. Assessment for possible Gilbert's syndrome vs. other liver pathology. Referral to Gastroenterology for further investigation.

## 2022-10-31 NOTE — Assessment & Plan Note (Signed)
Assess effectiveness of diclofenac and methocarbamol. Continuation of current regimen advised. Referral to Orthopedics for further evaluation.

## 2022-10-31 NOTE — Assessment & Plan Note (Signed)
Patient expresses interest in smoking cessation. Begin Varenicline Tartrate treatment. Discussed risks and benefits with patient.

## 2022-11-03 ENCOUNTER — Ambulatory Visit
Admission: RE | Admit: 2022-11-03 | Discharge: 2022-11-03 | Disposition: A | Discharge status: Home / Self Care | Payer: Commercial Managed Care - HMO | Source: Ambulatory Visit | Attending: Family Medicine | Admitting: Family Medicine

## 2022-11-03 DIAGNOSIS — Z1231 Encounter for screening mammogram for malignant neoplasm of breast: Secondary | ICD-10-CM

## 2022-11-04 ENCOUNTER — Other Ambulatory Visit: Payer: Self-pay | Admitting: Family Medicine

## 2022-11-04 DIAGNOSIS — N644 Mastodynia: Secondary | ICD-10-CM

## 2022-11-06 ENCOUNTER — Other Ambulatory Visit (INDEPENDENT_AMBULATORY_CARE_PROVIDER_SITE_OTHER): Payer: Commercial Managed Care - HMO

## 2022-11-06 ENCOUNTER — Encounter: Payer: Self-pay | Admitting: Orthopedic Surgery

## 2022-11-06 ENCOUNTER — Ambulatory Visit (INDEPENDENT_AMBULATORY_CARE_PROVIDER_SITE_OTHER): Payer: Commercial Managed Care - HMO | Admitting: Orthopedic Surgery

## 2022-11-06 DIAGNOSIS — G8929 Other chronic pain: Secondary | ICD-10-CM | POA: Diagnosis not present

## 2022-11-06 DIAGNOSIS — M25512 Pain in left shoulder: Secondary | ICD-10-CM

## 2022-11-06 DIAGNOSIS — M542 Cervicalgia: Secondary | ICD-10-CM | POA: Diagnosis not present

## 2022-11-06 MED ORDER — PREDNISONE 10 MG PO TABS
10.0000 mg | ORAL_TABLET | Freq: Every day | ORAL | 0 refills | Status: DC
Start: 2022-11-06 — End: 2022-12-16

## 2022-11-06 NOTE — Progress Notes (Signed)
Office Visit Note   Patient: Katherine Ramirez           Date of Birth: [birth year]           MRN: [id number] Visit Date: 11/06/2022              Requested by: Garnette Gunner, MD [address] [place],  [place] [address] PCP: Garnette Gunner, MD  Chief Complaint  Patient presents with   Left Shoulder - Pain   Neck - Pain      HPI: Patient is a 57 year old woman who presents with off-and-on left shoulder pain for 4 years.  Patient states that recently she has been having cramping in her hand and increased neck pain and decreased grip strength on the left.  She states she is dropping things.  Assessment & Plan: Visit Diagnoses:  1. Chronic left shoulder pain   2. Neck pain     Plan: Will start with a low-dose prednisone reevaluate in 4 weeks.  Anticipate we would need to obtain an MRI scan of her cervical spine.  Follow-Up Instructions: No follow-ups on file.   Ortho Exam  Patient is alert, oriented, no adenopathy, well-dressed, normal affect, normal respiratory effort. Examination patient has full range of motion of the left shoulder.  She has mild pain to palpation of the biceps tendon and mild pain with Neer and Hawkins impingement test.  The thoracic outlet and medial scapular border are tender to palpation.  Manual motor strengthening in both upper extremities is symmetric in both grip strength elbow flexion and shoulder abduction.  Imaging: No results found. No images are attached to the encounter.  Labs: Lab Results  Component Value Date   HGBA1C 5.0 10/28/2022   ESRSEDRATE 21 10/28/2022   CRP <1.0 10/28/2022     Lab Results  Component Value Date   ALBUMIN 4.2 10/28/2022   ALBUMIN 3.6 11/19/2012    No results found for: "MG" No results found for: "VD25OH"  No results found for: "PREALBUMIN"    Latest Ref Rng & Units 10/28/2022   10:39 AM 11/19/2012    2:52 PM  CBC EXTENDED  WBC 4.0 - 10.5 K/uL 4.4  7.1   RBC 3.87 - 5.11 Mil/uL 3.86  4.24    Hemoglobin 12.0 - 15.0 g/dL 56.4  33.2   HCT 95.1 - 46.0 % 37.6  41.3   Platelets 150.0 - 400.0 K/uL 302.0  252   NEUT# 1.4 - 7.7 K/uL 2.0  4.9   Lymph# 0.7 - 4.0 K/uL 1.9  1.3      There is no height or weight on file to calculate BMI.  Orders:  Orders Placed This Encounter  Procedures   XR Shoulder Left   XR Cervical Spine 2 or 3 views   No orders of the defined types were placed in this encounter.    Procedures: No procedures performed  Clinical Data: No additional findings.  ROS:  All other systems negative, except as noted in the HPI. Review of Systems  Objective: Vital Signs: LMP 03/20/2016 (Exact Date)   Specialty Comments:  No specialty comments available.  PMFS History: Patient Active Problem List   Diagnosis Date Noted   Vasomotor symptoms due to menopause 10/31/2022   Elevated bilirubin 10/31/2022   Nausea 10/31/2022   Tobacco use disorder 10/31/2022   Heel spur, right 10/31/2022   Chronic left shoulder pain 07/29/2022   Chronic conjunctivitis of left eye 07/29/2022   Past Medical History:  Diagnosis Date  Asthma    Bipolar 1 disorder    Hypertension     Family History  Problem Relation Age of Onset   Hypertension Mother    Heart failure Father    Cancer - Colon Neg Hx     Past Surgical History:  Procedure Laterality Date   APPENDECTOMY     HERNIA REPAIR     Social History   Occupational History   Not on file  Tobacco Use   Smoking status: Every Day    Packs/day: .5    Types: Cigarettes    Passive exposure: Never   Smokeless tobacco: Never  Vaping Use   Vaping Use: Never used  Substance and Sexual Activity   Alcohol use: Yes    Comment: Occasionally   Drug use: No   Sexual activity: Yes    Birth control/protection: Condom, I.U.D.

## 2022-11-17 ENCOUNTER — Telehealth: Payer: Self-pay | Admitting: Pharmacy Technician

## 2022-11-17 NOTE — Telephone Encounter (Signed)
Status of PA in separate encounter 

## 2022-11-17 NOTE — Telephone Encounter (Signed)
Patient Advocate Encounter   Received notification that prior authorization for Diclofenac Sodium 75MG  dr tablets is required.   PA submitted on 11/17/2022 Key [id number] Insurance [employer] Electronic Prior Authorization Form Status is pending

## 2022-11-20 ENCOUNTER — Ambulatory Visit: Payer: Commercial Managed Care - HMO

## 2022-11-20 ENCOUNTER — Other Ambulatory Visit (HOSPITAL_COMMUNITY)
Admission: RE | Admit: 2022-11-20 | Discharge: 2022-11-20 | Disposition: A | Discharge status: Home / Self Care | Payer: Commercial Managed Care - HMO | Source: Ambulatory Visit | Attending: Obstetrics | Admitting: Obstetrics

## 2022-11-20 ENCOUNTER — Ambulatory Visit (INDEPENDENT_AMBULATORY_CARE_PROVIDER_SITE_OTHER): Payer: Commercial Managed Care - HMO | Admitting: Obstetrics

## 2022-11-20 ENCOUNTER — Encounter: Payer: Self-pay | Admitting: Obstetrics

## 2022-11-20 VITALS — BP 126/70 | HR 57 | Ht 63.0 in | Wt 154.4 lb

## 2022-11-20 DIAGNOSIS — N898 Other specified noninflammatory disorders of vagina: Secondary | ICD-10-CM | POA: Diagnosis not present

## 2022-11-20 DIAGNOSIS — Z124 Encounter for screening for malignant neoplasm of cervix: Secondary | ICD-10-CM

## 2022-11-20 NOTE — Telephone Encounter (Signed)
Patient Advocate Encounter  Received a fax from [facility] regarding Prior Authorization for Diclofenac Sodium 75MG  dr tablets.   Key: [id number]  Authorization has been DENIED due to    Determination letter attached to patient chart

## 2022-11-20 NOTE — Progress Notes (Signed)
Obstetrics & Gynecology Office Visit   Chief Complaint:  Chief Complaint  Patient presents with   Gynecologic Exam    History of Present Illness: Katherine Ramirez is a new patient , sent by her PCP for a pap smear. She is a widow who lives alone. History of three children, al SVDs, who are in their 30s now. She relates no history of abnormal pap smears. She shares that it has been over 5 years since her last pap. She is not sexually active. Works with disabled adults.   Review of Systems:  Review of Systems  Constitutional: Negative.   HENT: Negative.    Eyes: Negative.   Respiratory: Negative.    Cardiovascular: Negative.   Gastrointestinal: Negative.   Genitourinary:  Positive for urgency.       Occasional  Musculoskeletal: Negative.   Skin: Negative.   All other systems reviewed and are negative.    Past Medical History:  Past Medical History:  Diagnosis Date   Asthma    Bipolar 1 disorder (HCC)    Hypertension     Past Surgical History:  Past Surgical History:  Procedure Laterality Date   APPENDECTOMY     HERNIA REPAIR      Gynecologic History: Patient's last menstrual period was 03/20/2016 (exact date).  Obstetric History: No obstetric history on file.  Family History:  Family History  Problem Relation Age of Onset   Hypertension Mother    Heart failure Father    Cancer - Colon Neg Hx     Social History:  Social History   Socioeconomic History   Marital status: Widowed    Spouse name: Not on file   Number of children: Not on file   Years of education: Not on file   Highest education level: Not on file  Occupational History   Not on file  Tobacco Use   Smoking status: Every Day    Packs/day: .5    Types: Cigarettes    Passive exposure: Never   Smokeless tobacco: Never  Vaping Use   Vaping Use: Never used  Substance and Sexual Activity   Alcohol use: Yes    Comment: Occasionally   Drug use: No   Sexual activity: Yes    Birth  control/protection: Condom, I.U.D.  Other Topics Concern   Not on file  Social History Narrative   ** Merged History Encounter **       Social Determinants of Health   Financial Resource Strain: Not on file  Food Insecurity: Not on file  Transportation Needs: Not on file  Physical Activity: Not on file  Stress: Not on file  Social Connections: Not on file  Intimate Partner Violence: Not on file    Allergies:  Allergies  Allergen Reactions   Grass Pollen(K-O-R-T-Swt Vern) Cough, Itching and Shortness Of Breath   Lanolin Hives, Itching, Shortness Of Breath and Swelling    Medications: Prior to Admission medications   Medication Sig Start Date End Date Taking? Authorizing Provider  diclofenac (VOLTAREN) 75 MG EC tablet Take 1 tablet (75 mg total) by mouth 2 (two) times daily. 10/28/22  Yes Garnette Gunner, MD  methocarbamol (ROBAXIN) 500 MG tablet Take 1 tablet (500 mg total) by mouth 2 (two) times daily. 10/28/22  Yes Garnette Gunner, MD  predniSONE (DELTASONE) 10 MG tablet Take 1 tablet (10 mg total) by mouth daily with breakfast. 11/06/22  Yes Nadara Mustard, MD  Varenicline Tartrate, Starter, (CHANTIX STARTING MONTH PAK) 0.5 MG X  11 & 1 MG X 42 TBPK Take as prescribed on pack 10/28/22  Yes Garnette Gunner, MD  diclofenac Sodium (VOLTAREN) 1 % GEL Apply 4 g topically 4 (four) times daily as needed. Patient not taking: Reported on 10/28/2022 07/29/22   Garnette Gunner, MD    Physical Exam Vitals:  Vitals:   11/20/22 0937  BP: 126/70  Pulse: (!) 57   Patient's last menstrual period was 03/20/2016 (exact date).  Physical Exam Constitutional:      Appearance: Normal appearance.  Cardiovascular:     Rate and Rhythm: Normal rate and regular rhythm.     Pulses: Normal pulses.     Heart sounds: Normal heart sounds.  Pulmonary:     Effort: Pulmonary effort is normal.     Breath sounds: Normal breath sounds.  Abdominal:     General: Abdomen is flat.     Palpations:  Abdomen is soft.  Genitourinary:    General: Normal vulva.     Vagina: Vaginal discharge present.     Rectum: Normal.     Comments: Mild cystocele noted. Scant vaginal discharge noted. Her uterus is retroverted, non enlarged. No CMT, no adnexal tenderness noted Normal external genitalia without rashes.  Aptima swab retrieved to test vaginal discharge for yeast or BV Neurological:     Mental Status: She is alert.      Assessment: 57 y.o. No obstetric history on file. Referred for pap smear by PCP Vaginal discharge  Plan: Problem List Items Addressed This Visit   None Visit Diagnoses     Vaginal discharge    -  Primary   Relevant Orders   Cervicovaginal ancillary only   Cervical cancer screening       Relevant Orders   Cytology - PAP   Cervicovaginal ancillary only   Screen for STD (sexually transmitted disease)         We will follow up based on her results.  Mirna Mires, CNM  11/20/2022 12:36 PM

## 2022-11-24 ENCOUNTER — Other Ambulatory Visit: Payer: Commercial Managed Care - HMO

## 2022-11-24 LAB — CERVICOVAGINAL ANCILLARY ONLY
Bacterial Vaginitis (gardnerella): NEGATIVE
Candida Glabrata: NEGATIVE
Candida Vaginitis: NEGATIVE
Comment: NEGATIVE
Comment: NEGATIVE
Comment: NEGATIVE

## 2022-11-24 LAB — CYTOLOGY - PAP
Comment: NEGATIVE
Diagnosis: NEGATIVE
High risk HPV: NEGATIVE

## 2022-11-24 NOTE — Telephone Encounter (Signed)
Tried calling patient, but no voicemail option available.  

## 2022-11-26 ENCOUNTER — Ambulatory Visit
Admission: RE | Admit: 2022-11-26 | Discharge: 2022-11-26 | Disposition: A | Discharge status: Home / Self Care | Payer: Commercial Managed Care - HMO | Source: Ambulatory Visit | Attending: Family Medicine | Admitting: Family Medicine

## 2022-11-26 DIAGNOSIS — R7401 Elevation of levels of liver transaminase levels: Secondary | ICD-10-CM | POA: Diagnosis not present

## 2022-11-26 DIAGNOSIS — R17 Unspecified jaundice: Secondary | ICD-10-CM

## 2022-11-26 NOTE — Telephone Encounter (Signed)
Tried calling patient, but no voicemail option available.  

## 2022-11-27 IMAGING — CR DG SHOULDER 2+V*L*
3 series · 3 of 3 positions shown · non-contrast
Comparison: None.

CLINICAL DATA: Left shoulder pain after fall last week.

EXAM:
LEFT SHOULDER - 2+ VIEW

[w shoulder external left]
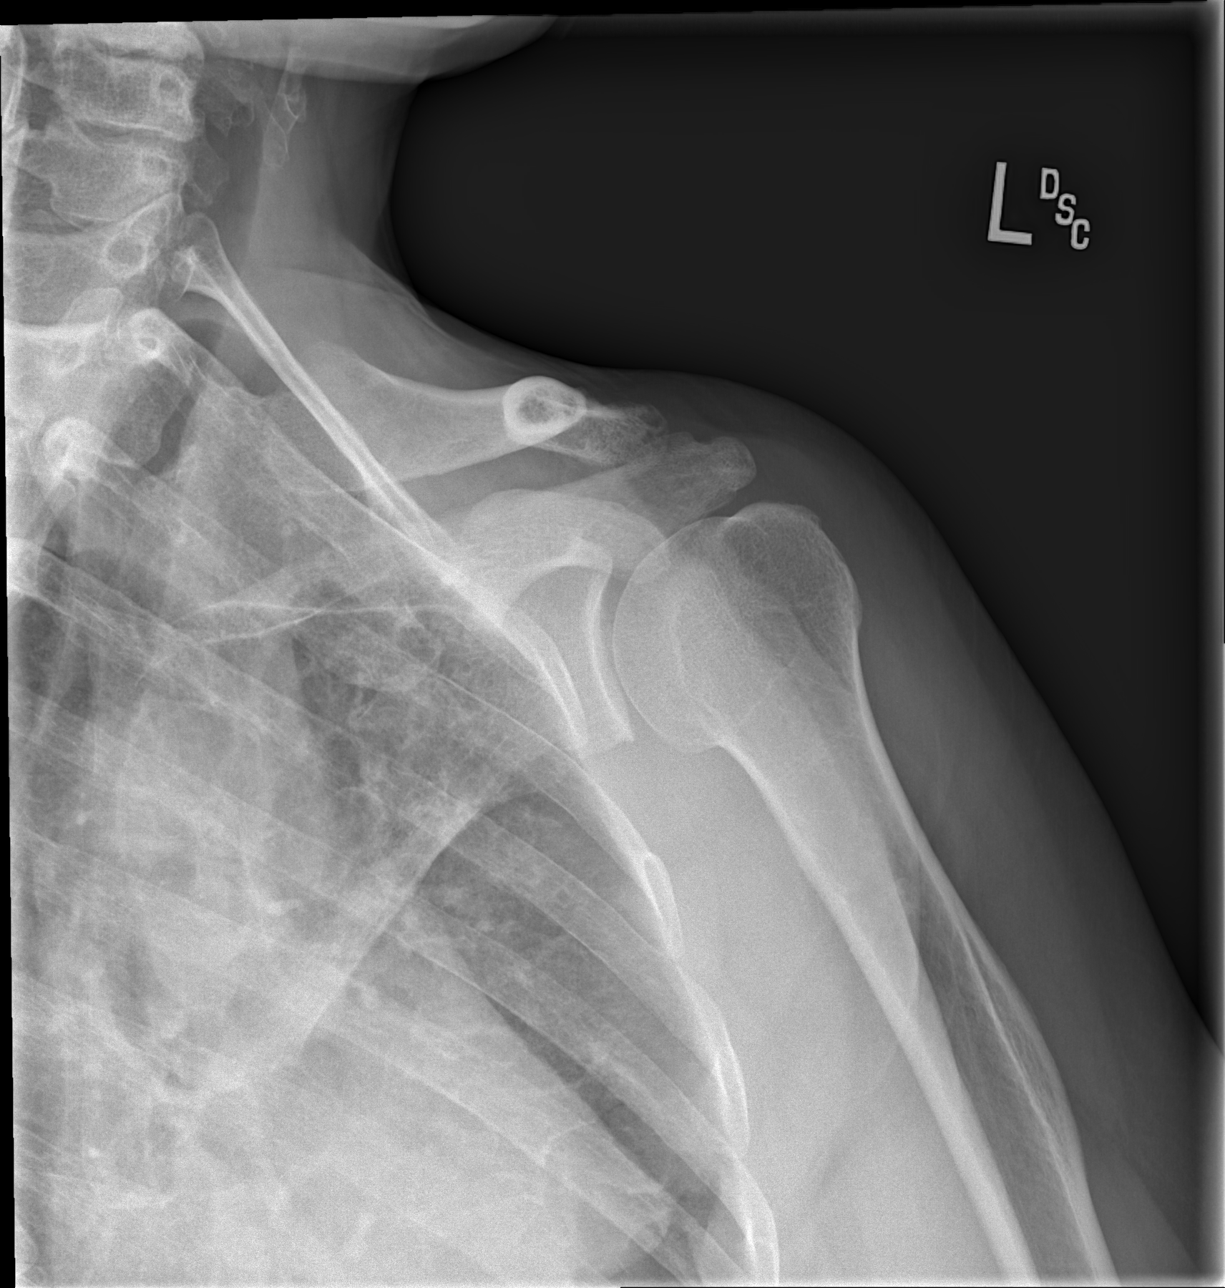

[w shoulder y-view left]
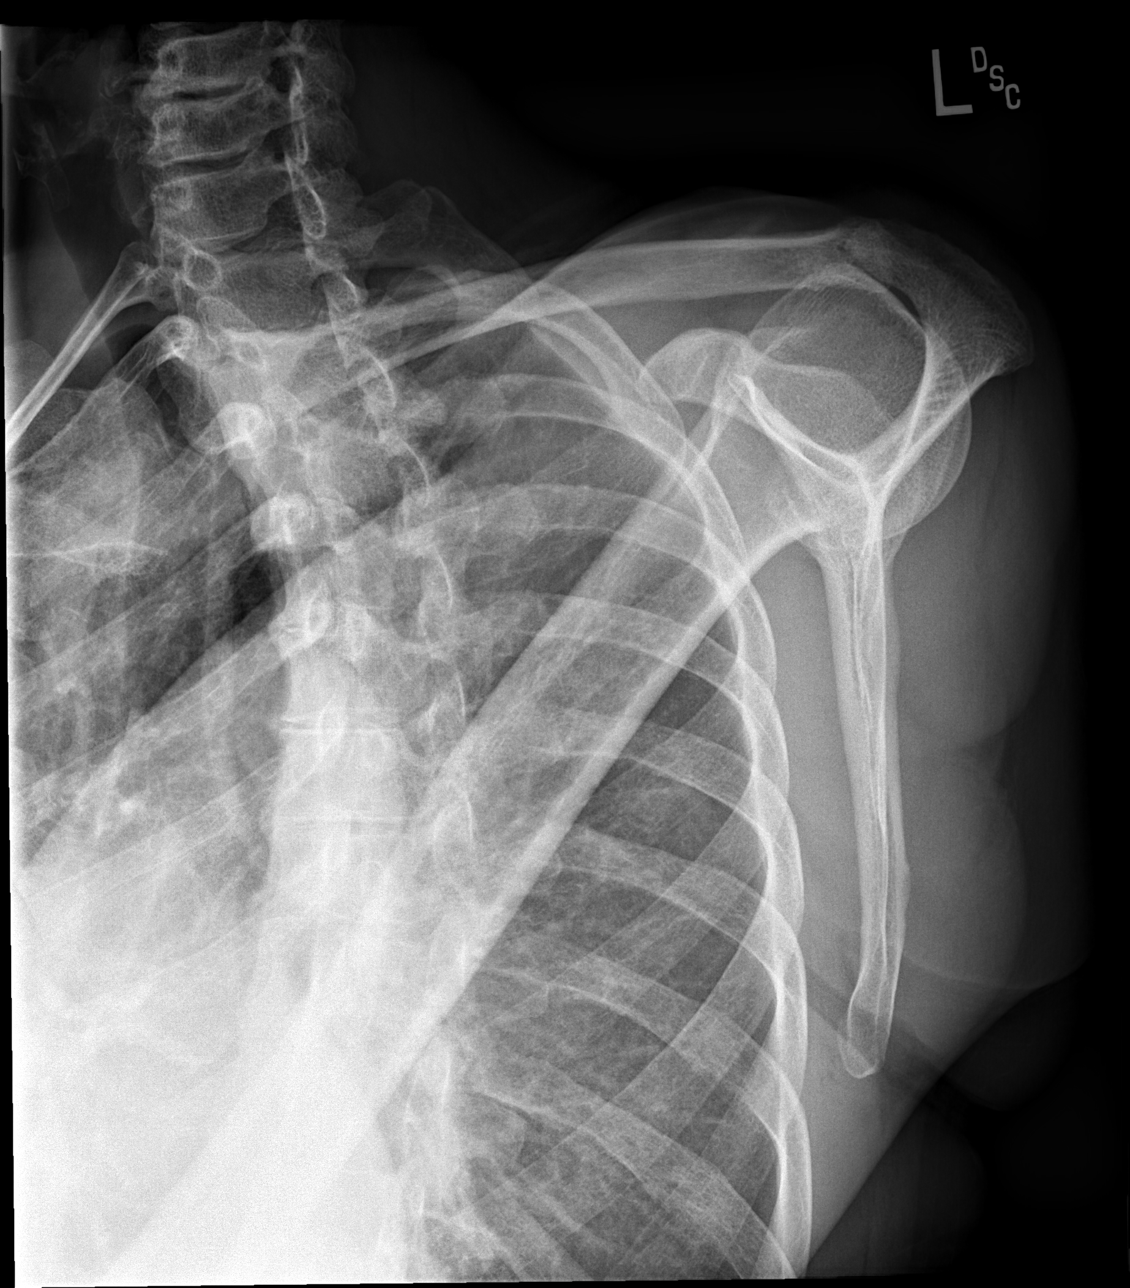

[x shoulder axillary left]
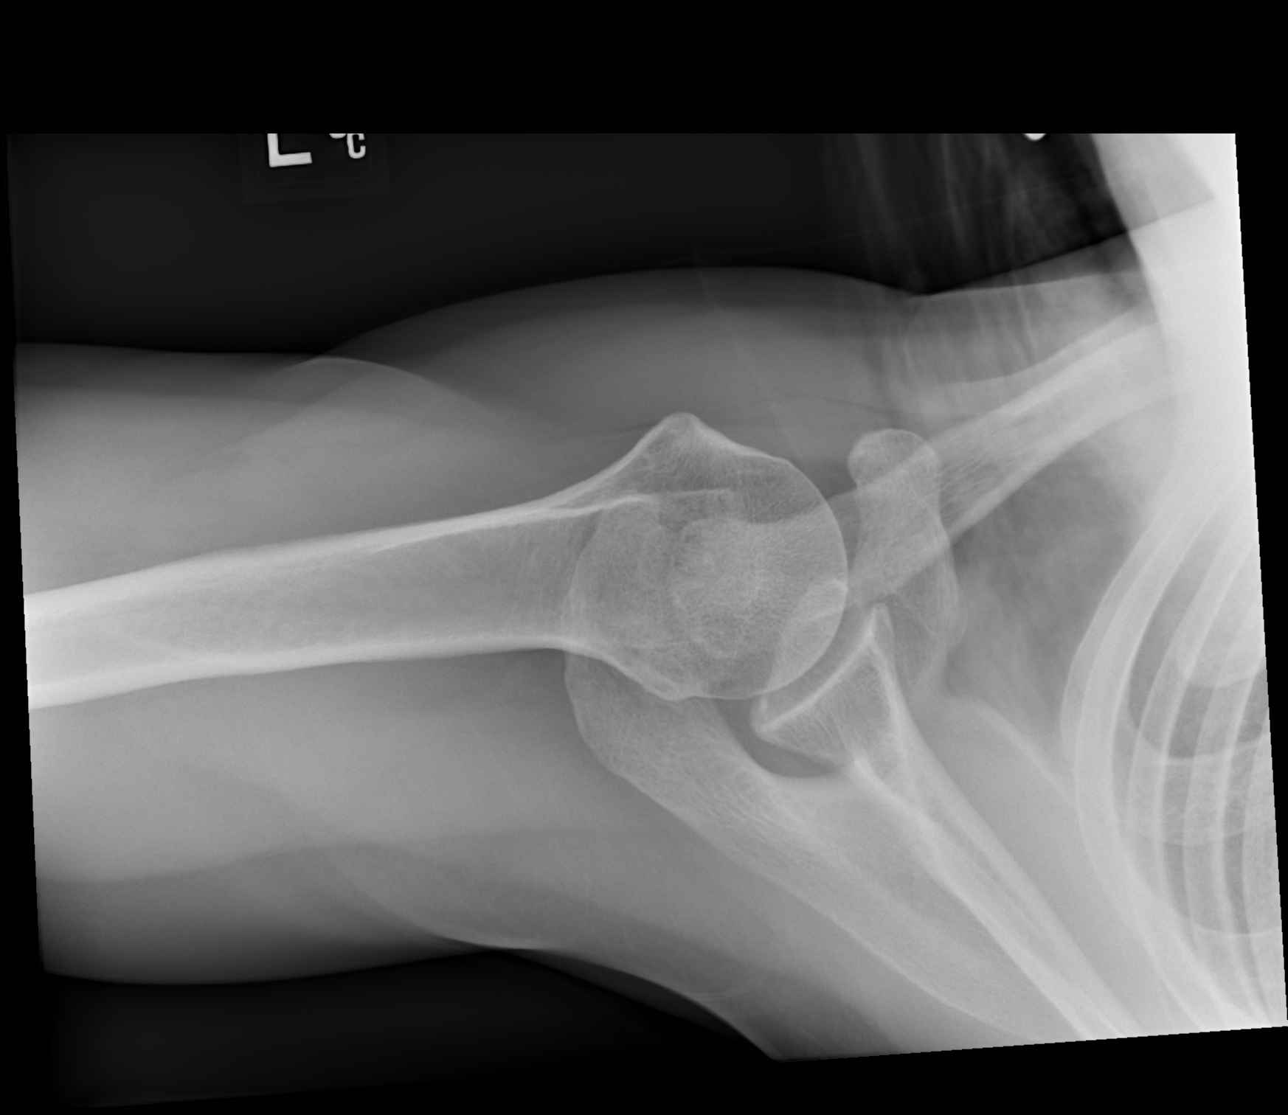

[3 of 3 positions shown; findings below may reference images not displayed]

FINDINGS: There is no evidence of fracture or dislocation. There is no
evidence of arthropathy or other focal bone abnormality. Soft
tissues are unremarkable.
IMPRESSION: Negative.

## 2022-12-01 ENCOUNTER — Ambulatory Visit: Payer: Commercial Managed Care - HMO | Admitting: Family Medicine

## 2022-12-10 ENCOUNTER — Other Ambulatory Visit: Payer: Commercial Managed Care - HMO

## 2022-12-10 NOTE — Patient Instructions (Signed)
Visit Information  Katherine Ramirez was given information about Medicaid Managed Care team care coordination services as a part of their [facility] Community [facility] Medicaid benefit. Katherine Ramirez verbally consented to engagement with the [facility] Managed Care team.   If you are experiencing a medical emergency, please call 911 or report to your local emergency department or urgent care.   If you have a non-emergency medical problem during routine business hours, please contact your provider's office and ask to speak with a nurse.   For questions related to your [facility], please call: [phone] or visit the homepage here: [email or web address]  If you would like to schedule transportation through your [facility], please call the following number at least 2 days in advance of your appointment: [phone]   Rides for urgent appointments can also be made after hours by calling Member Services.  Call the Behavioral Health Crisis Line at [phone], at any time, 24 hours a day, 7 days a week. If you are in danger or need immediate medical attention call 911.  If you would like help to quit smoking, call 1-800-QUIT-NOW ((815)657-9635) OR Espaol: 1-855-Djelo-Ya (1-324-401-0272) o para ms informacin haga clic aqu or Text READY to 536-644 to register via text  Katherine Ramirez - following are the goals we discussed in your visit today:   Goals Addressed   None      Social Worker will follow up in 30 days.   Katherine Ramirez, Katherine Ramirez, MHA [facility]  Managed Medicaid Social Worker [phone]   Following is a copy of your plan of care:  There are no care plans that you recently modified to display for this patient.

## 2022-12-10 NOTE — Patient Outreach (Signed)
Medicaid Managed Care Social Work Note  12/10/2022 Name:  Katherine Ramirez MRN:  [id number] DOB:  [birth year]  Katherine Ramirez is an 57 y.o. year old female who is a primary patient of Garnette Gunner, MD.  The Medicaid Managed Care Coordination team was consulted for assistance with:  Community Resources   Katherine Ramirez was given information about Medicaid Managed Care Coordination team services today. Katherine Ramirez Patient agreed to services and verbal consent obtained.  Engaged with patient  for by telephone forinitial visit in response to referral for case management and/or care coordination services.   Assessments/Interventions:  Review of past medical history, allergies, medications, health status, including review of consultants reports, laboratory and other test data, was performed as part of comprehensive evaluation and provision of chronic care management services.  SDOH: (Social Determinant of Health) assessments and interventions performed: BSW complete a telephone outreach with patient, she states she got behind on her rent and is it need of some assistance for half of it. Her rent is 900 monthly. 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 Patient states she is okay with food and transportation, she does used [facility] transportation. BSW will email patient resources to [email or web address].  Advanced Directives Status:  Not addressed in this encounter.  Care Plan                 Allergies  Allergen Reactions   Grass Pollen(K-O-R-T-Swt Vern) Cough, Itching and Shortness Of Breath   Lanolin Hives, Itching, Shortness Of Breath and Swelling    Medications Reviewed Today     Reviewed by Mirna Mires, CNM (Certified Nurse Midwife) on 11/20/22 at 1009  Med List Status: <None>   Medication Order Taking? Sig Documenting Provider Last Dose Status Informant    Discontinued 11/20/22 1009 (Completed Course)   diclofenac (VOLTAREN) 75 MG EC tablet [id number] Yes Take 1 tablet (75  mg total) by mouth 2 (two) times daily. Garnette Gunner, MD Taking Active   diclofenac Sodium (VOLTAREN) 1 % GEL [id number] No Apply 4 g topically 4 (four) times daily as needed.  Patient not taking: Reported on 10/28/2022   Garnette Gunner, MD Not Taking Active   methocarbamol (ROBAXIN) 500 MG tablet [id number] Yes Take 1 tablet (500 mg total) by mouth 2 (two) times daily. Garnette Gunner, MD Taking Active   predniSONE (DELTASONE) 10 MG tablet [id number] Yes Take 1 tablet (10 mg total) by mouth daily with breakfast. Nadara Mustard, MD Taking Active   Varenicline Tartrate, Starter, (CHANTIX STARTING MONTH PAK) 0.5 MG X 11 & 1 MG X 42 TBPK [id number] Yes Take as prescribed on pack Garnette Gunner, MD Taking Active             Patient Active Problem List   Diagnosis Date Noted   Vasomotor symptoms due to menopause 10/31/2022   Elevated bilirubin 10/31/2022   Nausea 10/31/2022   Tobacco use disorder 10/31/2022   Heel spur, right 10/31/2022   Chronic left shoulder pain 07/29/2022   Chronic conjunctivitis of left eye 07/29/2022    Conditions to be addressed/monitored per PCP order:   Community resources  There are no care plans that you recently modified to display for this patient.   Follow up:  Patient agrees to Care Plan and Follow-up.  Plan: The Managed Medicaid care management team will reach out to the patient again over the next 30 days.  Date/time of next scheduled Social Work care management/care coordination outreach:  01/12/23  Abelino Derrick, MHA [place]  Managed [facility] Social Worker [phone]

## 2022-12-11 ENCOUNTER — Ambulatory Visit (INDEPENDENT_AMBULATORY_CARE_PROVIDER_SITE_OTHER): Payer: Commercial Managed Care - HMO | Admitting: Orthopedic Surgery

## 2022-12-11 DIAGNOSIS — M7731 Calcaneal spur, right foot: Secondary | ICD-10-CM

## 2022-12-11 DIAGNOSIS — M25512 Pain in left shoulder: Secondary | ICD-10-CM | POA: Diagnosis not present

## 2022-12-11 DIAGNOSIS — M722 Plantar fascial fibromatosis: Secondary | ICD-10-CM | POA: Diagnosis not present

## 2022-12-11 MED ORDER — DICLOFENAC SODIUM 1 % EX GEL
4.0000 g | Freq: Four times a day (QID) | CUTANEOUS | 3 refills | Status: DC | PRN
Start: 2022-12-11 — End: 2022-12-16

## 2022-12-15 ENCOUNTER — Encounter: Payer: Self-pay | Admitting: Orthopedic Surgery

## 2022-12-15 NOTE — Progress Notes (Signed)
Office Visit Note   Patient: Katherine Ramirez           Date of Birth: 12/02/1965           MRN: [id number] Visit Date: 12/11/2022              Requested by: Garnette Gunner, MD [address] [place],  [place] [address] PCP: Garnette Gunner, MD  Chief Complaint  Patient presents with   Neck - Follow-up      HPI: Patient is a 57 year old woman who is seen in follow-up for multiple issues.  Patient was started on low-dose prednisone for her neck symptoms.  Patient states her neck and shoulder are feeling better.  She states she is having increasing chronic pain in both feet and ankles.  She is status post a steroid injection in the plantar fascia of the right foot in January.  She states she is on her feet a lot at work.  Assessment & Plan: Visit Diagnoses:  1. Plantar fasciitis   2. Heel spur, right   3. Acute pain of left shoulder     Plan: Will have patient follow-up with Dr. Shon Baton for evaluation for shockwave therapy for the plantar fascia pain on the right.  Recommended trial of Voltaren gel.  Follow-Up Instructions: No follow-ups on file.   Ortho Exam  Patient is alert, oriented, no adenopathy, well-dressed, normal affect, normal respiratory effort. Examination-patient has good pulses bilaterally.  She has good range of motion of the foot and ankle.  Radiographs of the right foot in January showed no bony abnormalities.  She does have tenderness to palpation of the origin of the plantar fascia worse on the right than the left.  She has good dorsiflexion of the ankles bilaterally with dorsiflexion of 20 degrees.  Review of the radiographs shows an ant eater deformity of the calcaneus, she does have subtalar motion.  Imaging: No results found. No images are attached to the encounter.  Labs: Lab Results  Component Value Date   HGBA1C 5.0 10/28/2022   ESRSEDRATE 21 10/28/2022   CRP <1.0 10/28/2022     Lab Results  Component Value Date   ALBUMIN 4.2  10/28/2022   ALBUMIN 3.6 11/19/2012    No results found for: "MG" No results found for: "VD25OH"  No results found for: "PREALBUMIN"    Latest Ref Rng & Units 10/28/2022   10:39 AM 11/19/2012    2:52 PM  CBC EXTENDED  WBC 4.0 - 10.5 K/uL 4.4  7.1   RBC 3.87 - 5.11 Mil/uL 3.86  4.24   Hemoglobin 12.0 - 15.0 g/dL 69.6  29.5   HCT 28.4 - 46.0 % 37.6  41.3   Platelets 150.0 - 400.0 K/uL 302.0  252   NEUT# 1.4 - 7.7 K/uL 2.0  4.9   Lymph# 0.7 - 4.0 K/uL 1.9  1.3      There is no height or weight on file to calculate BMI.  Orders:  No orders of the defined types were placed in this encounter.  Meds ordered this encounter  Medications   diclofenac Sodium (VOLTAREN) 1 % GEL    Sig: Apply 4 g topically 4 (four) times daily as needed.    Dispense:  100 g    Refill:  3     Procedures: No procedures performed  Clinical Data: No additional findings.  ROS:  All other systems negative, except as noted in the HPI. Review of Systems  Objective: Vital Signs: LMP  03/20/2016 (Exact Date)   Specialty Comments:  No specialty comments available.  PMFS History: Patient Active Problem List   Diagnosis Date Noted   Vasomotor symptoms due to menopause 10/31/2022   Elevated bilirubin 10/31/2022   Nausea 10/31/2022   Tobacco use disorder 10/31/2022   Heel spur, right 10/31/2022   Chronic left shoulder pain 07/29/2022   Chronic conjunctivitis of left eye 07/29/2022   Past Medical History:  Diagnosis Date   Asthma    Bipolar 1 disorder (HCC)    Hypertension     Family History  Problem Relation Age of Onset   Hypertension Mother    Heart failure Father    Cancer - Colon Neg Hx     Past Surgical History:  Procedure Laterality Date   APPENDECTOMY     HERNIA REPAIR     Social History   Occupational History   Not on file  Tobacco Use   Smoking status: Every Day    Packs/day: .5    Types: Cigarettes    Passive exposure: Never   Smokeless tobacco: Never  Vaping Use    Vaping Use: Never used  Substance and Sexual Activity   Alcohol use: Yes    Comment: Occasionally   Drug use: No   Sexual activity: Yes    Birth control/protection: Condom, I.U.D.

## 2022-12-16 ENCOUNTER — Encounter: Payer: Self-pay | Admitting: Family Medicine

## 2022-12-16 ENCOUNTER — Ambulatory Visit (INDEPENDENT_AMBULATORY_CARE_PROVIDER_SITE_OTHER): Payer: Commercial Managed Care - HMO | Admitting: Family Medicine

## 2022-12-16 VITALS — BP 122/80 | HR 68 | Temp 97.6°F | Wt 155.8 lb

## 2022-12-16 DIAGNOSIS — M7731 Calcaneal spur, right foot: Secondary | ICD-10-CM

## 2022-12-16 DIAGNOSIS — F17200 Nicotine dependence, unspecified, uncomplicated: Secondary | ICD-10-CM

## 2022-12-16 DIAGNOSIS — M722 Plantar fascial fibromatosis: Secondary | ICD-10-CM | POA: Diagnosis not present

## 2022-12-16 MED ORDER — VARENICLINE TARTRATE 1 MG PO TABS
1.0000 mg | ORAL_TABLET | Freq: Two times a day (BID) | ORAL | 0 refills | Status: AC
Start: 2022-12-16 — End: 2023-03-16

## 2022-12-16 MED ORDER — CELECOXIB 200 MG PO CAPS
200.0000 mg | ORAL_CAPSULE | Freq: Two times a day (BID) | ORAL | 0 refills | Status: AC | PRN
Start: 2022-12-16 — End: 2023-01-15

## 2022-12-16 MED ORDER — NICOTINE 21 MG/24HR TD PT24
21.0000 mg | MEDICATED_PATCH | Freq: Every day | TRANSDERMAL | 0 refills | Status: DC
Start: 2022-12-16 — End: 2023-10-28

## 2022-12-16 NOTE — Progress Notes (Signed)
Assessment/Plan:   Problem List Items Addressed This Visit       Musculoskeletal and Integument   Heel spur, right - Primary   Relevant Medications   celecoxib (CELEBREX) 200 MG capsule   Plantar fasciitis    The patient has continuing pain in the right foot from plantar fasciitis and heel spur. Previous use of Voltaren provided some relief but was cost prohibitive. Patient is exploring shockwave therapy as recommended by Dr. Lajoyce Corners.  Plan:  Prescribe Celecoxib 200 mg twice a day for pain relief. Continue with recommended shockwave therapy. Monitor response to Celecoxib and consider alternative medications if pain persists. Follow-up in 3 months to evaluate treatment efficacy.        Other   Tobacco use disorder    The patient has been smoking for 15 years and currently smokes about 7-8 cigarettes a day primarily when at home. The patient is on varenicline 1 mg twice a day, which helps when taken but they often forget to use it.  Plan:  Continue Varenicline 1 mg twice a day. Prescribe 21 mg nicotine patches to aid with nicotine cravings. Patient advised to use Varenicline regularly and consider using patches especially during non-working periods. Follow-up in 3 months to monitor progress.      Relevant Medications   varenicline (CHANTIX) 1 MG tablet   nicotine (NICODERM CQ) 21 mg/24hr patch    Medications Discontinued During This Encounter  Medication Reason   diclofenac (VOLTAREN) 75 MG EC tablet    diclofenac Sodium (VOLTAREN) 1 % GEL    methocarbamol (ROBAXIN) 500 MG tablet    predniSONE (DELTASONE) 10 MG tablet    Varenicline Tartrate, Starter, (CHANTIX STARTING MONTH PAK) 0.5 MG X 11 & 1 MG X 42 TBPK     Return for smoking.    Subjective:   Encounter date: 12/16/2022  Mckinna Mapes is a 57 y.o. female who has Chronic left shoulder pain; Vasomotor symptoms due to menopause; Tobacco use disorder; Heel spur, right; Chronic conjunctivitis of left eye; and  Plantar fasciitis on their problem list..   She  has a past medical history of Asthma, Bipolar 1 disorder (HCC), Elevated bilirubin (10/31/2022), and Hypertension..   CHIEF COMPLAINT: Follow-up on physical exam, medication use, and ongoing health issues.  HISTORY OF PRESENT ILLNESS:  Smoking Cessation: Patient is on Chantix (Varenicline) 1 mg twice a day, which helps when taken but patient often forgets to use it. No reported side effects. Patient started smoking 15 years ago, currently smoking about 7-8 cigarettes a day mainly when at home. Patient's smoking decreases while working.  Plantar Fasciitis: Patient has a history of plantar fasciitis in the right foot. Recently used Voltaren 75 mg twice daily but the cost is prohibitive.ltaren gel provided additional relief, but incomplete pain control. Patient has residual pain especially after resting. Considering shockwave therapy as suggested by Dr. Lajoyce Corners.    Elevated Bilirubin History: Previously elevated bilirubin levels have since normalized. Ultrasound results were normal.   Review of Systems  Constitutional:  Negative for chills, diaphoresis, fever, malaise/fatigue and weight loss.  HENT:  Negative for congestion, ear discharge, ear pain and hearing loss.   Eyes:  Negative for blurred vision, double vision, photophobia, pain, discharge and redness.  Respiratory:  Negative for cough, sputum production, shortness of breath and wheezing.   Cardiovascular:  Negative for chest pain and palpitations.  Gastrointestinal:  Negative for abdominal pain, blood in stool, constipation, diarrhea, heartburn, melena, nausea and vomiting.  Genitourinary:  Negative for  dysuria, flank pain, frequency, hematuria and urgency.  Musculoskeletal:  Positive for joint pain (Right heel pain). Negative for myalgias.  Skin:  Negative for itching and rash.  Neurological:  Negative for dizziness, tingling, tremors, speech change, seizures, loss of consciousness,  weakness and headaches.  Psychiatric/Behavioral:  Negative for depression, hallucinations, memory loss, substance abuse and suicidal ideas. The patient does not have insomnia.   All other systems reviewed and are negative.   Past Surgical History:  Procedure Laterality Date   APPENDECTOMY     HERNIA REPAIR      Outpatient Medications Prior to Visit  Medication Sig Dispense Refill   methocarbamol (ROBAXIN) 500 MG tablet Take 1 tablet (500 mg total) by mouth 2 (two) times daily. 20 tablet 0   Varenicline Tartrate, Starter, (CHANTIX STARTING MONTH PAK) 0.5 MG X 11 & 1 MG X 42 TBPK Take as prescribed on pack 53 each 0   diclofenac (VOLTAREN) 75 MG EC tablet Take 1 tablet (75 mg total) by mouth 2 (two) times daily. (Patient not taking: Reported on 12/16/2022) 30 tablet 0   diclofenac Sodium (VOLTAREN) 1 % GEL Apply 4 g topically 4 (four) times daily as needed. (Patient not taking: Reported on 12/16/2022) 100 g 3   predniSONE (DELTASONE) 10 MG tablet Take 1 tablet (10 mg total) by mouth daily with breakfast. (Patient not taking: Reported on 12/16/2022) 30 tablet 0   No facility-administered medications prior to visit.    Family History  Problem Relation Age of Onset   Hypertension Mother    Heart failure Father    Cancer - Colon Neg Hx     Social History   Socioeconomic History   Marital status: Widowed    Spouse name: Not on file   Number of children: Not on file   Years of education: Not on file   Highest education level: Associate degree: occupational, Scientist, product/process development, or vocational program  Occupational History   Not on file  Tobacco Use   Smoking status: Every Day    Packs/day: 1.00    Years: 40.00    Additional pack years: 0.00    Total pack years: 40.00    Types: Cigarettes    Passive exposure: Never   Smokeless tobacco: Never  Vaping Use   Vaping Use: Never used  Substance and Sexual Activity   Alcohol use: Yes    Comment: Occasionally   Drug use: No   Sexual activity: Yes     Birth control/protection: Condom, I.U.D.  Other Topics Concern   Not on file  Social History Narrative   ** Merged History Encounter **       Social Determinants of Health   Financial Resource Strain: High Risk (11/30/2022)   Overall Financial Resource Strain (CARDIA)    Difficulty of Paying Living Expenses: Very hard  Food Insecurity: Food Insecurity Present (11/30/2022)   Hunger Vital Sign    Worried About Running Out of Food in the Last Year: Often true    Ran Out of Food in the Last Year: Often true  Transportation Needs: Unmet Transportation Needs (11/30/2022)   PRAPARE - Administrator, Civil Service (Medical): Yes    Lack of Transportation (Non-Medical): Yes  Physical Activity: Insufficiently Active (11/30/2022)   Exercise Vital Sign    Days of Exercise per Week: 2 days    Minutes of Exercise per Session: 20 min  Stress: Stress Concern Present (11/30/2022)   [employer] of Occupational Health - Occupational Stress Questionnaire  Feeling of Stress : To some extent  Social Connections: Moderately Integrated (11/30/2022)   Social Connection and Isolation Panel [NHANES]    Frequency of Communication with Friends and Family: Once a week    Frequency of Social Gatherings with Friends and Family: Twice a week    Attends Religious Services: More than 4 times per year    Active Member of [employer] or Organizations: Yes    Attends Banker Meetings: More than 4 times per year    Marital Status: Widowed  Catering manager Violence: Not on file                                                                                                  Objective:  Physical Exam: BP 122/80 (BP Location: Left Arm, Patient Position: Sitting, Cuff Size: Large)   Pulse 68   Temp 97.6 F (36.4 C) (Temporal)   Wt 155 lb 12.8 oz (70.7 kg)   LMP 03/20/2016 (Exact Date)   SpO2 97%   BMI 27.60 kg/m     Physical Exam Constitutional:      General: She is not in acute  distress.    Appearance: Normal appearance. She is not ill-appearing or toxic-appearing.  HENT:     Head: Normocephalic and atraumatic.     Nose: Nose normal. No congestion.  Eyes:     General: No scleral icterus.    Extraocular Movements: Extraocular movements intact.  Cardiovascular:     Rate and Rhythm: Normal rate and regular rhythm.     Pulses: Normal pulses.     Heart sounds: Normal heart sounds.  Pulmonary:     Effort: Pulmonary effort is normal. No respiratory distress.     Breath sounds: Normal breath sounds.  Abdominal:     General: Abdomen is flat. Bowel sounds are normal.     Palpations: Abdomen is soft.  Musculoskeletal:        General: Normal range of motion.  Lymphadenopathy:     Cervical: No cervical adenopathy.  Skin:    General: Skin is warm and dry.     Findings: No rash.  Neurological:     General: No focal deficit present.     Mental Status: She is alert and oriented to person, place, and time. Mental status is at baseline.  Psychiatric:        Mood and Affect: Mood normal.        Behavior: Behavior normal.        Thought Content: Thought content normal.        Judgment: Judgment normal.     US Abdomen Limited RUQ (LIVER/GB)  Result Date: 11/26/2022 CLINICAL DATA:  Elevated LFTs EXAM: ULTRASOUND ABDOMEN LIMITED RIGHT UPPER QUADRANT COMPARISON:  None Available. FINDINGS: Gallbladder: No gallstones or wall thickening visualized. No sonographic Murphy sign noted by sonographer. Common bile duct: Diameter: 3.6 mm Liver: No focal lesion identified. Within normal limits in parenchymal echogenicity. Portal vein is patent on color Doppler imaging with normal direction of blood flow towards the liver. Other: None. IMPRESSION: No cholelithiasis or sonographic evidence for acute cholecystitis. Electronically  Signed   By: Annia Belt M.D.   On: 11/26/2022 10:18   XR Shoulder Left  Result Date: 11/06/2022 2 view radiographs of the left shoulder shows a congruent  joint with no bony abnormalities.  XR Cervical Spine 2 or 3 views  Result Date: 11/06/2022 2 view radiographs of the cervical spine shows advanced degenerative disc disease at C5-6 with bone-on-bone contact.   Recent Results (from the past 2160 hour(s))  Protime-INR     Status: None   Collection Time: 10/28/22 10:27 AM  Result Value Ref Range   INR 1.0 0.8 - 1.0 ratio   Prothrombin Time 11.1 9.6 - 13.1 sec  APTT     Status: None   Collection Time: 10/28/22 10:27 AM  Result Value Ref Range   aPTT 34.7 25.4 - 36.8 SEC  TSH     Status: None   Collection Time: 10/28/22 10:39 AM  Result Value Ref Range   TSH 2.73 0.35 - 5.50 uIU/mL  Lipid panel     Status: Abnormal   Collection Time: 10/28/22 10:39 AM  Result Value Ref Range   Cholesterol 199 0 - 200 mg/dL    Comment: ATP III Classification       Desirable:  < 200 mg/dL               Borderline High:  200 - 239 mg/dL          High:  > = 409 mg/dL   Triglycerides 81.1 0.0 - 149.0 mg/dL    Comment: Normal:  <914 mg/dLBorderline High:  150 - 199 mg/dL   HDL 78.29 >56.21 mg/dL   VLDL 30.8 0.0 - 65.7 mg/dL   LDL Cholesterol 846 (H) 0 - 99 mg/dL   Total CHOL/HDL Ratio 3     Comment:                Men          Women1/2 Average Risk     3.4          3.3Average Risk          5.0          4.42X Average Risk          9.6          7.13X Average Risk          15.0          11.0                       NonHDL 125.53     Comment: NOTE:  Non-HDL goal should be 30 mg/dL higher than patient's LDL goal (i.e. LDL goal of < 70 mg/dL, would have non-HDL goal of < 100 mg/dL)  Hemoglobin N6E     Status: None   Collection Time: 10/28/22 10:39 AM  Result Value Ref Range   Hgb A1c MFr Bld 5.0 4.6 - 6.5 %    Comment: Glycemic Control Guidelines for People with Diabetes:Non Diabetic:  <6%Goal of Therapy: <7%Additional Action Suggested:  >8%   CBC with Differential/Platelet     Status: Abnormal   Collection Time: 10/28/22 10:39 AM  Result Value Ref Range   WBC  4.4 4.0 - 10.5 K/uL   RBC 3.86 (L) 3.87 - 5.11 Mil/uL   Hemoglobin 12.8 12.0 - 15.0 g/dL   HCT 95.2 84.1 - 32.4 %   MCV 97.4 78.0 - 100.0 fl   MCHC 34.0 30.0 - 36.0 g/dL  RDW 13.5 11.5 - 15.5 %   Platelets 302.0 150.0 - 400.0 K/uL   Neutrophils Relative % 45.0 43.0 - 77.0 %   Lymphocytes Relative 43.2 12.0 - 46.0 %   Monocytes Relative 8.8 3.0 - 12.0 %   Eosinophils Relative 2.6 0.0 - 5.0 %   Basophils Relative 0.4 0.0 - 3.0 %   Neutro Abs 2.0 1.4 - 7.7 K/uL   Lymphs Abs 1.9 0.7 - 4.0 K/uL   Monocytes Absolute 0.4 0.1 - 1.0 K/uL   Eosinophils Absolute 0.1 0.0 - 0.7 K/uL   Basophils Absolute 0.0 0.0 - 0.1 K/uL  Comprehensive metabolic panel     Status: None   Collection Time: 10/28/22 10:39 AM  Result Value Ref Range   Sodium 139 135 - 145 mEq/L   Potassium 3.9 3.5 - 5.1 mEq/L   Chloride 102 96 - 112 mEq/L   CO2 29 19 - 32 mEq/L   Glucose, Bld 81 70 - 99 mg/dL   BUN 10 6 - 23 mg/dL   Creatinine, Ser 7.82 0.40 - 1.20 mg/dL   Total Bilirubin 1.0 0.2 - 1.2 mg/dL   Alkaline Phosphatase 74 39 - 117 U/L   AST 20 0 - 37 U/L   ALT 17 0 - 35 U/L   Total Protein 6.9 6.0 - 8.3 g/dL   Albumin 4.2 3.5 - 5.2 g/dL   GFR 95.62 >13.08 mL/min    Comment: Calculated using the CKD-EPI Creatinine Equation (2021)   Calcium 9.5 8.4 - 10.5 mg/dL  Lipase     Status: Abnormal   Collection Time: 10/28/22 10:39 AM  Result Value Ref Range   Lipase 62.0 (H) 11.0 - 59.0 U/L  Amylase     Status: None   Collection Time: 10/28/22 10:39 AM  Result Value Ref Range   Amylase 53 27 - 131 U/L  Sedimentation rate     Status: None   Collection Time: 10/28/22 10:39 AM  Result Value Ref Range   Sed Rate 21 0 - 30 mm/hr  C-reactive protein     Status: None   Collection Time: 10/28/22 10:39 AM  Result Value Ref Range   CRP <1.0 0.5 - 20.0 mg/dL  Urinalysis, Routine w reflex microscopic     Status: None   Collection Time: 10/28/22 10:40 AM  Result Value Ref Range   Color, Urine YELLOW Yellow;Lt.  Yellow;Straw;Dark Yellow;Amber;Green;Red;Brown   APPearance CLEAR Clear;Turbid;Slightly Cloudy;Cloudy   Specific Gravity, Urine 1.015 1.000 - 1.030   pH 6.5 5.0 - 8.0   Total Protein, Urine NEGATIVE Negative   Urine Glucose NEGATIVE Negative   Ketones, ur NEGATIVE Negative   Bilirubin Urine NEGATIVE Negative   Hgb urine dipstick NEGATIVE Negative   Urobilinogen, UA 0.2 0.0 - 1.0   Leukocytes,Ua NEGATIVE Negative   Nitrite NEGATIVE Negative   WBC, UA 0-2/hpf 0-2/hpf   RBC / HPF 0-2/hpf 0-2/hpf   Squamous Epithelial / HPF Rare(0-4/hpf) Rare(0-4/hpf)  Cytology - PAP     Status: None   Collection Time: 11/20/22 10:08 AM  Result Value Ref Range   High risk HPV Negative    Adequacy      Satisfactory for evaluation; transformation zone component PRESENT.   Diagnosis      - Negative for intraepithelial lesion or malignancy (NILM)   Comment Normal Reference Range HPV - Negative   Cervicovaginal ancillary only     Status: None   Collection Time: 11/20/22 10:08 AM  Result Value Ref Range   Bacterial Vaginitis (gardnerella)  Negative    Candida Vaginitis Negative    Candida Glabrata Negative    Comment      Normal Reference Range Bacterial Vaginosis - Negative   Comment Normal Reference Range Candida Species - Negative    Comment Normal Reference Range Candida Galbrata - Negative         Garner Nash, MD, MS

## 2022-12-16 NOTE — Assessment & Plan Note (Signed)
The patient has been smoking for 15 years and currently smokes about 7-8 cigarettes a day primarily when at home. The patient is on varenicline 1 mg twice a day, which helps when taken but they often forget to use it.  Plan:  Continue Varenicline 1 mg twice a day. Prescribe 21 mg nicotine patches to aid with nicotine cravings. Patient advised to use Varenicline regularly and consider using patches especially during non-working periods. Follow-up in 3 months to monitor progress.

## 2022-12-16 NOTE — Patient Instructions (Addendum)
Take Celebrex (200 mg) twice a day as needed for right heel pain. You have been prescribed 60 pills for 30 days. Continue using Chantix (1 mg) twice a day as a part of your smoking cessation therapy. Consider using nicotine patches (21 mg) to help manage nicotine cravings and gradually decrease smoking. You may cut them in half if needed. Follow up with Dr. Lajoyce Corners regarding the shockwave therapy for plantar fasciitis. Plan a follow-up visit in three months to evaluate both smoking cessation progress and heel pain         [facility] [address] [place] [address] [phone]

## 2022-12-16 NOTE — Assessment & Plan Note (Signed)
The patient has continuing pain in the right foot from plantar fasciitis and heel spur. Previous use of Voltaren provided some relief but was cost prohibitive. Patient is exploring shockwave therapy as recommended by Dr. Lajoyce Corners.  Plan:  Prescribe Celecoxib 200 mg twice a day for pain relief. Continue with recommended shockwave therapy. Monitor response to Celecoxib and consider alternative medications if pain persists. Follow-up in 3 months to evaluate treatment efficacy.

## 2022-12-18 ENCOUNTER — Ambulatory Visit: Payer: Commercial Managed Care - HMO | Admitting: *Deleted

## 2022-12-22 ENCOUNTER — Ambulatory Visit (INDEPENDENT_AMBULATORY_CARE_PROVIDER_SITE_OTHER): Payer: Commercial Managed Care - HMO | Admitting: Sports Medicine

## 2022-12-22 DIAGNOSIS — M2142 Flat foot [pes planus] (acquired), left foot: Secondary | ICD-10-CM

## 2022-12-22 DIAGNOSIS — M722 Plantar fascial fibromatosis: Secondary | ICD-10-CM | POA: Diagnosis not present

## 2022-12-22 DIAGNOSIS — M7731 Calcaneal spur, right foot: Secondary | ICD-10-CM

## 2022-12-22 DIAGNOSIS — M2141 Flat foot [pes planus] (acquired), right foot: Secondary | ICD-10-CM | POA: Diagnosis not present

## 2022-12-22 NOTE — Progress Notes (Unsigned)
Katherine Ramirez - 57 y.o. female MRN [id number]  Date of birth: 1965/12/28  Office Visit Note: Visit Date: 12/22/2022 PCP: Garnette Gunner, MD Referred by: Garnette Gunner, MD  Subjective: Chief Complaint  Patient presents with   Right Foot - Pain   HPI: Katherine Ramirez is a pleasant 57 y.o. female who presents today for chronic right plantar fasciitis.  She has had chronic plantar fascia pain for many years now.  She has had multiple injections which only provided temporary relief.  She has tried heel pads and cups, she does sleep in a night splint.  Has used ice, stretching.  PCP prescribed Celebrex 200 mg to take once to twice daily as needed.   Pertinent ROS were reviewed with the patient and found to be negative unless otherwise specified above in HPI.   Assessment & Plan: Visit Diagnoses:  1. Plantar fasciitis, right   2. Pes planus of both feet   3. Heel spur, right    Plan: Discussed with Javonda treatment options for right plantar fasciitis.  I do think we need to correct her underlying pes planus as she does have significant loss of the longitudinal arch which I think is contributing.  Fitted her for arch support insoles today which she will wear with activity.  Through shared decision-making, did proceed with a trial of extracorporeal shockwave therapy to the plantar fascia, did have some immediate relief of her pain just following procedure.  This was performed over her calcaneal heel spur as well.  She may continue her Celebrex 200 mg once daily.  Will have her follow-up in 1 week for additional shockwave therapy, if she gets at least 20% improvement after these first 2 treatments may consider additional treatments.  Did give her towel scrunch rehab stretch to add to her home stretching/strengthening exercises. F/u in 1 week.  Follow-up: Return in about 1 week (around 12/29/2022) for for right plantar fascia (reg visit).   Meds & Orders: No orders of the defined types were  placed in this encounter.  No orders of the defined types were placed in this encounter.    Procedures: Procedure: ECSWT Indications:  Plantar fasciitis   Procedure Details Consent: Risks of procedure as well as the alternatives and risks of each were explained to the patient.  Verbal consent for procedure obtained. Time Out: Verified patient identification, verified procedure, site was marked, verified correct patient position. The area was cleaned with alcohol swab.     The right plantar fascia was targeted for Extracorporeal shockwave therapy.    Preset: plantar fasciitis Power Level: 90 mJ Frequency: 10 Hz Impulse/cycles: 3000 Head size: Regular   Patient tolerated procedure well without immediate complications.        Clinical History: No specialty comments available.  She reports that she has been smoking cigarettes. She has a 40.00 pack-year smoking history. She has never been exposed to tobacco smoke. She has never used smokeless tobacco.  Recent Labs    10/28/22 1039  HGBA1C 5.0    Objective:   Vital Signs: LMP 03/20/2016 (Exact Date)   Physical Exam  Gen: Well-appearing, in no acute distress; non-toxic CV:  Well-perfused. Warm.  Resp: Breathing unlabored on room air; no wheezing. Psych: Fluid speech in conversation; appropriate affect; normal thought process Neuro: Sensation intact throughout. No gross coordination deficits.   Ortho Exam - Right foot: + TTP over the medial plantar calcaneus near the plantar fascia insertion.  There is also very mild TTP over the  plantar calcaneal spur.  Negative heel squeeze.  No redness or swelling.  There is good range of motion about the ankle with flexion and extension. NVI.  - Bilateral feet: Notable pes planovalgus with excessive loss of longitudinal arch upon standing.  There is mild hyperpronation noted throughout gait phase.  Imaging:  *XR Right foot complete -3 views of the right foot including AP, lateral and  oblique views were independently reviewed and interpreted by myself.  X-rays show small early changes of a plantar calcaneal spur.  Otherwise no significant arthritic change about the foot or bony abnormality noted.  Past Medical/Family/Surgical/Social History: Medications & Allergies reviewed per EMR, new medications updated. Patient Active Problem List   Diagnosis Date Noted   Plantar fasciitis 12/16/2022   Vasomotor symptoms due to menopause 10/31/2022   Tobacco use disorder 10/31/2022   Heel spur, right 10/31/2022   Chronic left shoulder pain 07/29/2022   Chronic conjunctivitis of left eye 07/29/2022   Past Medical History:  Diagnosis Date   Asthma    Bipolar 1 disorder (HCC)    Elevated bilirubin 10/31/2022   Hypertension    Family History  Problem Relation Age of Onset   Hypertension Mother    Heart failure Father    Cancer - Colon Neg Hx    Past Surgical History:  Procedure Laterality Date   APPENDECTOMY     HERNIA REPAIR     Social History   Occupational History   Not on file  Tobacco Use   Smoking status: Every Day    Packs/day: 1.00    Years: 40.00    Additional pack years: 0.00    Total pack years: 40.00    Types: Cigarettes    Passive exposure: Never   Smokeless tobacco: Never  Vaping Use   Vaping Use: Never used  Substance and Sexual Activity   Alcohol use: Yes    Comment: Occasionally   Drug use: No   Sexual activity: Yes    Birth control/protection: Condom, I.U.D.

## 2022-12-22 NOTE — Progress Notes (Unsigned)
Right foot pain Chronic years of pain Has tried insoles, sleeps in the PF shoe at night Has had multiple injections that provided temporary relief

## 2022-12-23 ENCOUNTER — Other Ambulatory Visit: Payer: Commercial Managed Care - HMO | Admitting: *Deleted

## 2022-12-23 ENCOUNTER — Encounter: Payer: Self-pay | Admitting: Sports Medicine

## 2022-12-23 NOTE — Patient Outreach (Signed)
  Medicaid Managed Care   Unsuccessful Attempt Note   12/23/2022 Name: Katherine Ramirez MRN: [id number] DOB: [birth year]  Referred by: Garnette Gunner, MD Reason for referral : High Risk Managed Medicaid (Unsuccessful RNCM initial telephone outreach)   An unsuccessful telephone outreach was attempted today. The patient was referred to the case management team for assistance with care management and care coordination.    Follow Up Plan: The Managed Medicaid care management team will reach out to the patient again over the next 7 days.    Estanislado Emms RN, BSN [place]  Managed [facility] RN Care Coordinator [phone]

## 2022-12-23 NOTE — Patient Instructions (Signed)
Visit Information  Ms. Katherine Ramirez  - as a part of your Medicaid benefit, you are eligible for care management and care coordination services at no cost or copay. I was unable to reach you by phone today but would be happy to help you with your health related needs. Please feel free to call me @ [phone].   A member of the Managed Medicaid care management team will reach out to you again over the next 7 days.   Estanislado Emms RN, BSN [place]  Managed [facility] RN Care Coordinator [phone]

## 2022-12-26 ENCOUNTER — Ambulatory Visit
Admission: RE | Admit: 2022-12-26 | Discharge: 2022-12-26 | Disposition: A | Discharge status: Home / Self Care | Payer: Commercial Managed Care - HMO | Source: Ambulatory Visit | Attending: Family Medicine | Admitting: Family Medicine

## 2022-12-26 ENCOUNTER — Other Ambulatory Visit: Payer: Self-pay | Admitting: Family Medicine

## 2022-12-26 DIAGNOSIS — N644 Mastodynia: Secondary | ICD-10-CM

## 2022-12-26 DIAGNOSIS — N6315 Unspecified lump in the right breast, overlapping quadrants: Secondary | ICD-10-CM | POA: Diagnosis not present

## 2022-12-26 DIAGNOSIS — N6325 Unspecified lump in the left breast, overlapping quadrants: Secondary | ICD-10-CM | POA: Diagnosis not present

## 2022-12-26 DIAGNOSIS — N6320 Unspecified lump in the left breast, unspecified quadrant: Secondary | ICD-10-CM

## 2022-12-26 DIAGNOSIS — N6310 Unspecified lump in the right breast, unspecified quadrant: Secondary | ICD-10-CM

## 2023-01-01 ENCOUNTER — Ambulatory Visit: Payer: Commercial Managed Care - HMO | Admitting: Sports Medicine

## 2023-01-02 ENCOUNTER — Telehealth: Payer: Self-pay

## 2023-01-02 NOTE — Telephone Encounter (Signed)
Received a STAT request form from [facility] placed in provider office for review. Provider signed and returned form. Form faxed successfully at 10:35 am today. Placed form in scan folder.

## 2023-01-06 ENCOUNTER — Ambulatory Visit: Payer: Commercial Managed Care - HMO | Admitting: *Deleted

## 2023-01-07 ENCOUNTER — Ambulatory Visit
Admission: RE | Admit: 2023-01-07 | Discharge: 2023-01-07 | Disposition: A | Discharge status: Home / Self Care | Payer: Commercial Managed Care - HMO | Source: Ambulatory Visit | Attending: Family Medicine | Admitting: Family Medicine

## 2023-01-07 ENCOUNTER — Other Ambulatory Visit: Payer: Self-pay | Admitting: Family Medicine

## 2023-01-07 DIAGNOSIS — N6315 Unspecified lump in the right breast, overlapping quadrants: Secondary | ICD-10-CM | POA: Diagnosis not present

## 2023-01-07 DIAGNOSIS — N6320 Unspecified lump in the left breast, unspecified quadrant: Secondary | ICD-10-CM

## 2023-01-07 DIAGNOSIS — N6310 Unspecified lump in the right breast, unspecified quadrant: Secondary | ICD-10-CM

## 2023-01-07 DIAGNOSIS — N6325 Unspecified lump in the left breast, overlapping quadrants: Secondary | ICD-10-CM | POA: Diagnosis not present

## 2023-01-07 HISTORY — PX: BREAST BIOPSY: SHX20

## 2023-01-09 ENCOUNTER — Ambulatory Visit (INDEPENDENT_AMBULATORY_CARE_PROVIDER_SITE_OTHER): Payer: Medicaid Other | Admitting: Sports Medicine

## 2023-01-09 DIAGNOSIS — M2142 Flat foot [pes planus] (acquired), left foot: Secondary | ICD-10-CM

## 2023-01-09 DIAGNOSIS — M722 Plantar fascial fibromatosis: Secondary | ICD-10-CM | POA: Diagnosis not present

## 2023-01-09 DIAGNOSIS — M7731 Calcaneal spur, right foot: Secondary | ICD-10-CM | POA: Diagnosis not present

## 2023-01-09 DIAGNOSIS — M2141 Flat foot [pes planus] (acquired), right foot: Secondary | ICD-10-CM

## 2023-01-09 NOTE — Progress Notes (Signed)
Katherine Ramirez - 57 y.o. female MRN [id number]  Date of birth: 06-Jun-1966  Office Visit Note: Visit Date: 01/09/2023 PCP: Garnette Gunner, MD Referred by: Garnette Gunner, MD  Subjective: Chief Complaint  Patient presents with   Right Foot - Follow-up    Did shockwave x  week ago, states that she got good relief from it, and she is ready for another treatment of shockwave therapy   HPI: Katherine Ramirez is a pleasant 57 y.o. female who presents today for follow-up of right plantar fasciitis, heel spur, and pes planus.  Katherine Ramirez was pleased with the amount of relief she received from the first extracorporeal shockwave treatment.  Had definite relief over the plantar fascia as well as her heel spur.  She also has been wearing her arch support with a scaphoid pad insoles in her tennis shoes and this is giving her relief when she stands for long periods of time at work.  Still doing some of her home rehab exercises.  Continues with Celebrex 200 mg once daily.  Pertinent ROS were reviewed with the patient and found to be negative unless otherwise specified above in HPI.   Assessment & Plan: Visit Diagnoses:  1. Plantar fasciitis, right   2. Pes planus of both feet   3. Heel spur, right    Plan: Both Aide and I were pleased with the degree of improvement she received after the first treatment of extracorporeal shockwave therapy.  We did repeat her second treatment today for both the plantar fascia and her calcaneal heel spur.  I would like to continue in her orthotics with arch support especially when she is doing prolonged standing or on her feet for long periods of time at work.  She will continue her Celebrex 200 mg once daily, I did discuss today that as her pain continues to improve she may start transitioning to only as needed use.  She will continue her plantar fascia stretching and strengthening rehab exercises at home in the interim.  We will follow-up in 2 weeks for reevaluation and  consideration of repeat ESWT.  Follow-up: Return in about 2 weeks (around 01/23/2023) for for right heel and foot (reg visit).   Meds & Orders: No orders of the defined types were placed in this encounter.  No orders of the defined types were placed in this encounter.    Procedures: Procedure: ECSWT Indications:  Plantar fasciitis   Procedure Details Consent: Risks of procedure as well as the alternatives and risks of each were explained to the patient.  Verbal consent for procedure obtained. Time Out: Verified patient identification, verified procedure, site was marked, verified correct patient position. The area was cleaned with alcohol swab.     The right plantar fascia and calcaneal heel spur was targeted for Extracorporeal shockwave therapy.    Preset: plantar fasciitis, heel spur Power Level: 100 mJ  Frequency: 10 Hz Impulse/cycles: 3000 (2000, 1000 at each) Head size: Regular   Patient tolerated procedure well without immediate complications.      Clinical History: No specialty comments available.  She reports that she has been smoking cigarettes. She has a 40.00 pack-year smoking history. She has never been exposed to tobacco smoke. She has never used smokeless tobacco.  Recent Labs    10/28/22 1039  HGBA1C 5.0    Objective:   Vital Signs: LMP 03/20/2016 (Exact Date)   Physical Exam  Gen: Well-appearing, in no acute distress; non-toxic CV: Well-perfused. Warm.  Resp: Breathing unlabored on  room air; no wheezing. Psych: Fluid speech in conversation; appropriate affect; normal thought process Neuro: Sensation intact throughout. No gross coordination deficits.   Ortho Exam -Bilateral feet.  Notable pes planovalgus with loss of longitudinal arch.  Right foot there is mild TTP over the medial aspect of the calcaneus near the plantar fascia insertion but improved from previous visit.  No true pain over the calcaneal spur today.  Negative heel squeeze.  Imaging: No  results found.  Past Medical/Family/Surgical/Social History: Medications & Allergies reviewed per EMR, new medications updated. Patient Active Problem List   Diagnosis Date Noted   Plantar fasciitis 12/16/2022   Vasomotor symptoms due to menopause 10/31/2022   Tobacco use disorder 10/31/2022   Heel spur, right 10/31/2022   Chronic left shoulder pain 07/29/2022   Chronic conjunctivitis of left eye 07/29/2022   Past Medical History:  Diagnosis Date   Asthma    Bipolar 1 disorder (HCC)    Elevated bilirubin 10/31/2022   Hypertension    Family History  Problem Relation Age of Onset   Hypertension Mother    Heart failure Father    Breast cancer Cousin    Cancer - Colon Neg Hx    Past Surgical History:  Procedure Laterality Date   APPENDECTOMY     BREAST BIOPSY Right 01/07/2023   [place] RT BREAST BX W LOC DEV 1ST LESION IMG BX SPEC US GUIDE 01/07/2023 [facility]   BREAST BIOPSY Left 01/07/2023   [place] LT BREAST BX W LOC DEV 1ST LESION IMG BX SPEC US GUIDE 01/07/2023 [facility]   HERNIA REPAIR     Social History   Occupational History   Not on file  Tobacco Use   Smoking status: Every Day    Packs/day: 1.00    Years: 40.00    Additional pack years: 0.00    Total pack years: 40.00    Types: Cigarettes    Passive exposure: Never   Smokeless tobacco: Never  Vaping Use   Vaping Use: Never used  Substance and Sexual Activity   Alcohol use: Yes    Comment: Occasionally   Drug use: No   Sexual activity: Yes    Birth control/protection: Condom, I.U.D.

## 2023-01-12 ENCOUNTER — Other Ambulatory Visit: Payer: Commercial Managed Care - HMO

## 2023-01-12 NOTE — Patient Instructions (Signed)
  Medicaid Managed Care   Unsuccessful Outreach Note  01/12/2023 Name: Katherine Ramirez MRN: [id number] DOB: 06/06/1966  Referred by: Thompson, Aaron B, MD Reason for referral : High Risk Managed Medicaid (MM Social work Unsuccessful telephone outreach )   An unsuccessful telephone outreach was attempted today. The patient was referred to the case management team for assistance with care management and care coordination.   Follow Up Plan: The patient has been provided with contact information for the care management team and has been advised to call with any health related questions or concerns.  Blanka Rockholt, BSW, MHA [place]  Managed Medicaid Social Worker [phone]  

## 2023-01-12 NOTE — Patient Outreach (Signed)
  Medicaid Managed Care   Unsuccessful Outreach Note  01/12/2023 Name: Lexiann Zopfi MRN: [id number] DOB: [birth year]  Referred by: Garnette Gunner, MD Reason for referral : High Risk Managed Medicaid (MM Social work Unsuccessful telephone outreach )   An unsuccessful telephone outreach was attempted today. The patient was referred to the case management team for assistance with care management and care coordination.   Follow Up Plan: The patient has been provided with contact information for the care management team and has been advised to call with any health related questions or concerns.  Abelino Derrick, MHA [facility]  Managed [facility] Social Worker [phone]

## 2023-02-03 ENCOUNTER — Ambulatory Visit: Payer: Self-pay | Admitting: Surgery

## 2023-02-03 DIAGNOSIS — D242 Benign neoplasm of left breast: Secondary | ICD-10-CM

## 2023-02-03 DIAGNOSIS — D241 Benign neoplasm of right breast: Secondary | ICD-10-CM

## 2023-02-03 NOTE — H&P (Signed)
Subjective    Chief Complaint: New Consultation (BILATERAL Intraductal papillomas)       History of Present Illness: Katherine Ramirez is a 57 y.o. female who is seen today as an office consultation at the request of Dr. Janee Morn for evaluation of New Consultation (BILATERAL Intraductal papillomas) .     This is a 57 year old female who presents with recent left breast yellowish discharge and some intermittent left breast pain.  No previous breast problems.  No family history of breast cancer.  She underwent workup including bilateral diagnostic mammograms and bilateral ultrasounds.  In the right breast at 9:00 located 1 cm from the nipple there is an 8 x 8 x 4 mm mass.  In the left breast at 3:00 located 5 cm from the nipple there is another subcentimeter mass.  Both of these were biopsied and revealed a sclerosed intraductal papillomas.  No atypia was seen.  She is referred to discuss excision.  She states that the discharge has stopped from the left nipple.  The intermittent pain that she feels corresponds to the area where she had the biopsy in the lateral left breast.     Review of Systems: A complete review of systems was obtained from the patient.  I have reviewed this information and discussed as appropriate with the patient.  See HPI as well for other ROS.   Review of Systems  Constitutional: Negative.   HENT: Negative.    Eyes: Negative.   Respiratory: Negative.    Cardiovascular: Negative.   Gastrointestinal: Negative.   Genitourinary: Negative.   Musculoskeletal:  Positive for neck pain.  Skin: Negative.   Neurological: Negative.   Endo/Heme/Allergies:  Bruises/bleeds easily.  Psychiatric/Behavioral:  Positive for depression.         Medical History: Past Medical History      Past Medical History:  Diagnosis Date   Arthritis     Asthma, unspecified asthma severity, unspecified whether complicated, unspecified whether persistent (HHS-HCC)     GERD (gastroesophageal  reflux disease)     Hyperlipidemia     Hypertension          Problem List     Patient Active Problem List  Diagnosis   Tobacco use disorder        Past Surgical History       Past Surgical History:  Procedure Laterality Date   APPENDECTOMY       HERNIA REPAIR            Allergies  No Known Allergies     Medications Ordered Prior to Encounter        Current Outpatient Medications on File Prior to Visit  Medication Sig Dispense Refill   methocarbamoL (ROBAXIN) 500 MG tablet          No current facility-administered medications on file prior to visit.        Family History       Family History  Problem Relation Age of Onset   Coronary Artery Disease (Blocked arteries around heart) Mother     High blood pressure (Hypertension) Mother     Hyperlipidemia (Elevated cholesterol) Mother     Coronary Artery Disease (Blocked arteries around heart) Father          Tobacco Use History  Social History        Tobacco Use  Smoking Status Every Day   Types: Cigarettes  Smokeless Tobacco Not on file        Social History  Social History  Socioeconomic History   Marital status: Widowed  Tobacco Use   Smoking status: Every Day      Types: Cigarettes  Vaping Use   Vaping status: Unknown  Substance and Sexual Activity   Alcohol use: Yes   Drug use: Never    Social Determinants of Health        Financial Resource Strain: High Risk (11/30/2022)    Received from [facility]    Overall Financial Resource Strain (CARDIA)     Difficulty of Paying Living Expenses: Very hard  Food Insecurity: Food Insecurity Present (11/30/2022)    Received from [facility]    Hunger Vital Sign     Worried About Running Out of Food in the Last Year: Often true     Ran Out of Food in the Last Year: Often true  Transportation Needs: Unmet Transportation Needs (11/30/2022)    Received from [facility] - Transportation     Lack of Transportation (Medical): Yes      Lack of Transportation (Non-Medical): Yes  Physical Activity: Insufficiently Active (11/30/2022)    Received from [facility]    Exercise Vital Sign     Days of Exercise per Week: 2 days     Minutes of Exercise per Session: 20 min  Stress: Stress Concern Present (11/30/2022)    Received from [facility] - Occupational Stress Questionnaire     Feeling of Stress : To some extent  Social Connections: Moderately Integrated (11/30/2022)    Received from [facility]    Social Connection and Isolation Panel [NHANES]     Frequency of Communication with Friends and Family: Once a week     Frequency of Social Gatherings with Friends and Family: Twice a week     Attends Religious Services: More than 4 times per year     Active Member of [employer] or Organizations: Yes     Attends Banker Meetings: More than 4 times per year     Marital Status: Widowed        Objective:         Vitals:    02/03/23 1405  BP: (!) 147/85  Pulse: 66  Temp: 36.4 C (97.6 F)  SpO2: (!) 66%  Weight: 70.5 kg (155 lb 6.4 oz)  Height: 160 cm (5\' 3" )  PainSc: 0-No pain    Body mass index is 27.53 kg/m.   Physical Exam    Constitutional:  WDWN in NAD, conversant, no obvious deformities; lying in bed comfortably Eyes:  Pupils equal, round; sclera anicteric; moist conjunctiva; no lid lag HENT:  Oral mucosa moist; good dentition  Neck:  No masses palpated, trachea midline; no thyromegaly Lungs:  CTA bilaterally; normal respiratory effort Breasts:  symmetric, no nipple changes; no palpable masses or lymphadenopathy on either side; no nipple discharge.  Slight tenderness in the lateral left breast CV:  Regular rate and rhythm; no murmurs; extremities well-perfused with no edema Abd:  +bowel sounds, soft, non-tender, no palpable organomegaly; no palpable hernias Musc:  Normal gait; no apparent clubbing or cyanosis in extremities Lymphatic:  No palpable cervical  or axillary lymphadenopathy Skin:  Warm, dry; no sign of jaundice Psychiatric - alert and oriented x 4; calm mood and affect     Labs, Imaging and Diagnostic Testing: Diagnosis 1. Breast, left, needle core biopsy, 3 o'clock, 5 cmfn FRAGMENTS OF A SCLEROSED INTRADUCTAL PAPILLOMA FIBROCYSTIC CHANGES INCLUDING CYSTIC DILATATION OF DUCTS, APOCRINE  METAPLASIA AND USUAL DUCT HYPERPLASIA FOCAL FIBROMATOID CHANGE MICROCALCIFICATIONS PRESENT WITHIN USUAL DUCT HYPERPLASIA NEGATIVE FOR ATYPIA AND CARCINOMA 2. Breast, right, needle core biopsy, 9 o'clock, 1 cmfn FRAGMENTS OF A SCLEROSED PAPILLOMA FIBROCYSTIC CHANGES INCLUDING CYSTIC DILATATION OF DUCTS, APOCRINE METAPLASIA AND USUAL DUCT HYPERPLASIA NEGATIVE FOR MICROCALCIFICATIONS NEGATIVE FOR ATYPIA AND CARCINOMA Diagnosis Note 1. -2. Although no atypia is seen within these fragments of papilloma, a complete excision is recommended to exclude unsampled atypia or malignancy. Diagnosis called to Nickie at [facility] by Dr. Venetia Night on 01/08/2023 at 9:42 AM. Jerene Bears MD Pathologist, Electronic Signature (Case signed 01/08/2023)     CLINICAL DATA:  The patient presents with nonfocal lateral left breast pain and creamy spontaneous single duct left-sided nipple discharge.   EXAM: DIGITAL DIAGNOSTIC BILATERAL MAMMOGRAM WITH TOMOSYNTHESIS; ULTRASOUND LEFT BREAST LIMITED; ULTRASOUND RIGHT BREAST LIMITED   TECHNIQUE: Bilateral digital diagnostic mammography and breast tomosynthesis was performed.; Targeted ultrasound examination of the left breast was performed.; Targeted ultrasound examination of the right breast was performed   COMPARISON:  Previous exam(s).   ACR Breast Density Category b: There are scattered areas of fibroglandular density.   FINDINGS: Two masses are identified in the lateral right breast. There is a mass at an anterior to mid depth which may be associated with a milk duct.  The other mass is located laterally and more posteriorly. No other suspicious findings on the right.   There also appears to be an obscured mass in the lateral left breast at a posterior depth. No other suspicious findings on the left.   Targeted ultrasound is performed, showing an apparent intraductal mass on the right at 9 o'clock, 1 cm from the nipple measuring 8 by 8 x 4 mm. The mass is oval, hypoechoic, and circumscribed with parallel orientation and increased through transmission. There is also a simple cyst containing a layering calcification at 9 o'clock, 4 cm from the nipple correlating with the other right-sided mass. Axillary adenopathy.   No abnormalities are identified in the retroareolar region on the left. There appears to be an intraductal mass in the left breast at 3 o'clock, 5 cm from the nipple likely correlating with the obscured mass in this location on recent mammography. This mass is oval, hypo echoic, and circumscribed with parallel orientation and increased through transmission.   No axillary adenopathy.   IMPRESSION: Bilateral intraductal masses, most likely papillomas. No other suspicious findings.   RECOMMENDATION: Recommend ultrasound-guided biopsy of the bilateral intraductal masses.   I have discussed the findings and recommendations with the patient. If applicable, a reminder letter will be sent to the patient regarding the next appointment.   BI-RADS CATEGORY  4: Suspicious.     Electronically Signed   By: Gerome Sam III M.D.   On: 12/26/2022 14:08     Assessment and Plan:  Diagnoses and all orders for this visit:   Intraductal papilloma of both breasts     Bilateral radioactive seed localized lumpectomies.  The surgical procedure has been discussed with the patient.  Potential risks, benefits, alternative treatments, and expected outcomes have been explained.  All of the patient's questions at this time have been answered.  The  likelihood of reaching the patient's treatment goal is good.  The patient understand the proposed surgical procedure and wishes to proceed.     Lissa Morales, MD  02/03/2023 5:40 PM

## 2023-02-26 ENCOUNTER — Encounter (INDEPENDENT_AMBULATORY_CARE_PROVIDER_SITE_OTHER): Payer: Self-pay

## 2023-03-26 ENCOUNTER — Ambulatory Visit (INDEPENDENT_AMBULATORY_CARE_PROVIDER_SITE_OTHER): Payer: Commercial Managed Care - HMO | Admitting: Family Medicine

## 2023-03-26 ENCOUNTER — Encounter: Payer: Self-pay | Admitting: Family Medicine

## 2023-03-26 VITALS — BP 118/78 | HR 53 | Temp 96.5°F | Ht 63.0 in | Wt 155.4 lb

## 2023-03-26 DIAGNOSIS — Z1211 Encounter for screening for malignant neoplasm of colon: Secondary | ICD-10-CM

## 2023-03-26 DIAGNOSIS — R051 Acute cough: Secondary | ICD-10-CM | POA: Diagnosis not present

## 2023-03-26 DIAGNOSIS — M25512 Pain in left shoulder: Secondary | ICD-10-CM

## 2023-03-26 DIAGNOSIS — Z1159 Encounter for screening for other viral diseases: Secondary | ICD-10-CM

## 2023-03-26 DIAGNOSIS — M722 Plantar fascial fibromatosis: Secondary | ICD-10-CM

## 2023-03-26 DIAGNOSIS — F17209 Nicotine dependence, unspecified, with unspecified nicotine-induced disorders: Secondary | ICD-10-CM | POA: Diagnosis not present

## 2023-03-26 DIAGNOSIS — G8929 Other chronic pain: Secondary | ICD-10-CM

## 2023-03-26 DIAGNOSIS — D369 Benign neoplasm, unspecified site: Secondary | ICD-10-CM

## 2023-03-26 DIAGNOSIS — M7731 Calcaneal spur, right foot: Secondary | ICD-10-CM | POA: Diagnosis not present

## 2023-03-26 LAB — POC COVID19 BINAXNOW: SARS Coronavirus 2 Ag: NEGATIVE

## 2023-03-26 MED ORDER — VARENICLINE TARTRATE 1 MG PO TABS: 1.0000 mg | ORAL_TABLET | Freq: Two times a day (BID) | ORAL | 3 refills | Status: DC

## 2023-03-26 MED ORDER — ALBUTEROL SULFATE HFA 108 (90 BASE) MCG/ACT IN AERS
2.0000 | INHALATION_SPRAY | Freq: Four times a day (QID) | RESPIRATORY_TRACT | 0 refills | Status: DC | PRN
Start: 2023-03-26 — End: 2023-10-28

## 2023-03-26 MED ORDER — NICOTINE POLACRILEX 2 MG MT GUM: CHEWING_GUM | OROMUCOSAL | 0 refills | Status: DC

## 2023-03-26 NOTE — Patient Instructions (Signed)
Schedule an appointment with Dr. Shon Baton for follow-up on plantar fasciitis and heel spurs. Contact Dr. Audrie Lia office for an MRI follow-up on shoulder pain. Use nicotine gum as a nicotine replacement while continuing Chantix. Negative a COVID test today due to recent onset of cough; follow up if symptoms worsen. Take Albuterol inhaler as needed for wheezing and shortness of breath. Look out for referral to colonoscopy and pulmonology for lung cancer screening

## 2023-03-26 NOTE — Progress Notes (Signed)
Assessment/Plan:   Problem List Items Addressed This Visit       Musculoskeletal and Integument   Heel spur, right    Recommend follow-up with sports medicine      Plantar fasciitis     Other   Chronic left shoulder pain   Tobacco use disorder, continuous - Primary    Improved plan: Continue Chantix 1 mg twice daily. Introduce nicotine gum for additional support. defer PFT until next visit due to likely URI      Relevant Medications   varenicline (CHANTIX) 1 MG tablet   nicotine polacrilex (NICORETTE) 2 MG gum   Other Relevant Orders   Ambulatory referral to Smoking Cessation Program   Ambulatory Referral for Lung Cancer Scre   Intraductal papilloma    Await Medicaid authorization. Follow-up on surgery scheduling.      Acute cough    Recommend supportive care OTC.  Concern for underlying COPD due to smoking history.  Continue to monitor symptoms and concussions discussed.      Relevant Medications   albuterol (VENTOLIN HFA) 108 (90 Base) MCG/ACT inhaler   Other Relevant Orders   POC COVID-19 (Completed)   Other Visit Diagnoses     Screening for viral disease       Relevant Orders   Hepatitis C Antibody   HIV antibody (with reflex)   Screening for colon cancer       Relevant Orders   Ambulatory referral to Gastroenterology       There are no discontinued medications.  Return in about 3 months (around 06/25/2023) for smoking.    Subjective:   Encounter date: 03/26/2023  Katherine Ramirez is a 57 y.o. female who has Chronic left shoulder pain; Vasomotor symptoms due to menopause; Tobacco use disorder; Heel spur, right; Chronic conjunctivitis of left eye; Plantar fasciitis; Tobacco use disorder, continuous; Intraductal papilloma; and Acute cough on their problem list..   She  has a past medical history of Asthma, Bipolar 1 disorder (HCC), Elevated bilirubin (10/31/2022), and Hypertension..   Chief Complaint: Follow-up visit for smoking cessation, heel  spurs, and recent cough.  History of Present Illness: Smoking Cessation. Patient has been on Chantix (1 mg twice daily) for smoking cessation. She reports a significant reduction in smoking, currently down to 5 cigarettes per day from a pack a day. She started smoking at age 66 and has been smoking heavily until recently when seen 3 months ago. She occasionally uses nicotine pouches but prefers nicotine gum, particularly on hot days.  Heel Spurs. She has a history of heel spurs and plantar fasciitis, managed with extracorporeal shockwave therapy, orthotics, and occasional use of Celebrex. She reports not needing Celebrex daily as the other treatments have been effective.  Breast Health. Patient recently had a breast examination revealing an intraductal papilloma, requiring surgery. Medicaid coverage is pending, and she is awaiting authorization.  Recent Cough. She has had a cough for the past week without other respiratory symptoms but has a history of asthma.  Review of Systems  Constitutional:  Negative for chills, diaphoresis, fever, malaise/fatigue and weight loss.  HENT:  Negative for congestion, ear discharge, ear pain and hearing loss.   Eyes:  Negative for blurred vision, double vision, photophobia, pain, discharge and redness.  Respiratory:  Positive for cough and wheezing. Negative for sputum production and shortness of breath.   Cardiovascular:  Negative for chest pain and palpitations.  Gastrointestinal:  Negative for abdominal pain, blood in stool, constipation, diarrhea, heartburn, melena, nausea and vomiting.  Genitourinary:  Negative for dysuria, flank pain, frequency, hematuria and urgency.  Musculoskeletal:  Positive for joint pain (left shoulder pain, chronic). Negative for myalgias.  Skin:  Negative for itching and rash.  Neurological:  Negative for dizziness, tingling, tremors, speech change, seizures, loss of consciousness, weakness and headaches.  Psychiatric/Behavioral:   Negative for depression, hallucinations, memory loss, substance abuse and suicidal ideas. The patient does not have insomnia.   All other systems reviewed and are negative.   Past Surgical History:  Procedure Laterality Date   APPENDECTOMY     BREAST BIOPSY Right 01/07/2023   [place] RT BREAST BX W LOC DEV 1ST LESION IMG BX SPEC US GUIDE 01/07/2023 [facility]   BREAST BIOPSY Left 01/07/2023   [place] LT BREAST BX W LOC DEV 1ST LESION IMG BX SPEC US GUIDE 01/07/2023 [facility]   HERNIA REPAIR      Outpatient Medications Prior to Visit  Medication Sig Dispense Refill   nicotine (NICODERM CQ) 21 mg/24hr patch Place 1 patch (21 mg total) onto the skin daily. 28 patch 0   No facility-administered medications prior to visit.    Family History  Problem Relation Age of Onset   Hypertension Mother    Heart failure Father    Breast cancer Cousin    Cancer - Colon Neg Hx     Social History   Socioeconomic History   Marital status: Widowed    Spouse name: Not on file   Number of children: Not on file   Years of education: Not on file   Highest education level: Associate degree: occupational, Scientist, product/process development, or vocational program  Occupational History   Not on file  Tobacco Use   Smoking status: Every Day    Current packs/day: 0.10    Average packs/day: 1 pack/day for 50.7 years (50.4 ttl pk-yrs)    Types: Cigarettes    Start date: 29    Passive exposure: Never   Smokeless tobacco: Never  Vaping Use   Vaping status: Never Used  Substance and Sexual Activity   Alcohol use: Yes    Comment: Occasionally   Drug use: No   Sexual activity: Yes    Birth control/protection: Condom, I.U.D.  Other Topics Concern   Not on file  Social History Narrative   ** Merged History Encounter **       Social Determinants of Health   Financial Resource Strain: High Risk (11/30/2022)   Overall Financial Resource Strain (CARDIA)    Difficulty of Paying Living Expenses: Very hard  Food  Insecurity: Food Insecurity Present (11/30/2022)   Hunger Vital Sign    Worried About Running Out of Food in the Last Year: Often true    Ran Out of Food in the Last Year: Often true  Transportation Needs: Unmet Transportation Needs (11/30/2022)   PRAPARE - Administrator, Civil Service (Medical): Yes    Lack of Transportation (Non-Medical): Yes  Physical Activity: Insufficiently Active (11/30/2022)   Exercise Vital Sign    Days of Exercise per Week: 2 days    Minutes of Exercise per Session: 20 min  Stress: Stress Concern Present (11/30/2022)   [employer] of Occupational Health - Occupational Stress Questionnaire    Feeling of Stress : To some extent  Social Connections: Moderately Integrated (11/30/2022)   Social Connection and Isolation Panel [NHANES]    Frequency of Communication with Friends and Family: Once a week    Frequency of Social Gatherings with Friends and Family: Twice a week  Attends Religious Services: More than 4 times per year    Active Member of Clubs or Organizations: Yes    Attends Banker Meetings: More than 4 times per year    Marital Status: Widowed  Catering manager Violence: Not on file                                                                                                  Objective:  Physical Exam: BP 118/78   Pulse (!) 53   Temp (!) 96.5 F (35.8 C) (Temporal)   Ht 5\' 3"  (1.6 m)   Wt 155 lb 6.4 oz (70.5 kg)   LMP 03/20/2016 (Exact Date)   SpO2 99%   BMI 27.53 kg/m     Physical Exam Constitutional:      General: She is not in acute distress.    Appearance: Normal appearance. She is not ill-appearing or toxic-appearing.  HENT:     Head: Normocephalic and atraumatic.     Nose: Nose normal. No congestion.  Eyes:     General: No scleral icterus.    Extraocular Movements: Extraocular movements intact.  Cardiovascular:     Rate and Rhythm: Normal rate and regular rhythm.     Pulses: Normal pulses.      Heart sounds: Normal heart sounds.  Pulmonary:     Effort: Pulmonary effort is normal. No respiratory distress.     Breath sounds: Normal breath sounds.  Abdominal:     General: Abdomen is flat. Bowel sounds are normal.     Palpations: Abdomen is soft.  Musculoskeletal:        General: Normal range of motion.  Lymphadenopathy:     Cervical: No cervical adenopathy.  Skin:    General: Skin is warm and dry.     Findings: No rash.  Neurological:     General: No focal deficit present.     Mental Status: She is alert and oriented to person, place, and time. Mental status is at baseline.  Psychiatric:        Mood and Affect: Mood normal.        Behavior: Behavior normal.        Thought Content: Thought content normal.        Judgment: Judgment normal.     [place] RT BREAST BX W LOC DEV 1ST LESION IMG BX SPEC US GUIDE  Addendum Date: 01/09/2023   ADDENDUM REPORT: 01/09/2023 10:46 ADDENDUM: Pathology revealed FRAGMENTS OF A SCLEROSED INTRADUCTAL PAPILLOMA, FIBROCYSTIC CHANGES INCLUDING CYSTIC DILATATION OF DUCTS, APOCRINE METAPLASIA AND USUAL DUCT HYPERPLASIA, FOCAL FIBROMATOID CHANGE, MICROCALCIFICATIONS PRESENT WITHIN USUAL DUCT HYPERPLASIA of the LEFT breast, 3 o'clock, 5 cmfn, (venus clip). This was found to be concordant by Dr. Bary Richard with surgical consultation for excision recommended, per [facility] Breast Working Group protocol. Pathology revealed FRAGMENTS OF A SCLEROSED PAPILLOMA, FIBROCYSTIC CHANGES INCLUDING CYSTIC DILATATION OF DUCTS, APOCRINE METAPLASIA AND USUAL DUCT HYPERPLASIA of the RIGHT breast, 9 o'clock, 1 cmfn, (coil clip). This was found to be concordant by Dr. Bary Richard, with surgical consultation for excision recommended, per [facility] Breast Working Group  protocol. Pathology results were discussed with the patient by telephone. The patient reported doing well after the biopsies with tenderness and swelling at the sites. Post biopsy instructions and care were reviewed,  and questions were answered. The patient was encouraged to call [facility] for any additional concerns. My direct phone number was provided. Surgical consultation has been arranged with Dr. Manus Rudd at [facility] on February 03, 2023. Pathology results reported by Rene Kocher, RN on 01/09/2023. Electronically Signed   By: Bary Richard M.D.   On: 01/09/2023 10:46   Result Date: 01/09/2023 CLINICAL DATA:  Patient presents for ultrasound biopsies of bilateral intraductal masses. EXAM: ULTRASOUND GUIDED BILATERAL BREAST CORE NEEDLE BIOPSY COMPARISON:  Previous exam(s). PROCEDURE: I met with the patient and we discussed the procedure of ultrasound-guided biopsy, including benefits and alternatives. We discussed the high likelihood of a successful procedure. We discussed the risks of the procedure, including infection, bleeding, tissue injury, clip migration, and inadequate sampling. Informed written consent was given. The usual time-out protocol was performed immediately prior to the procedure. Site 1: Lesion quadrant: Outer Using sterile technique and 1% Lidocaine as local anesthetic, under direct ultrasound visualization, a 12 gauge spring-loaded device was used to perform biopsy of the LEFT breast mass at the 3 o'clock axis, 5 cm from the nipple, using a lateral approach. At the conclusion of the procedure, a venus tissue marker clip was deployed into the biopsy cavity. Site 2: Lesion quadrant: Outer, retroareolar Using sterile technique and 1% Lidocaine as local anesthetic, under direct ultrasound visualization, a 12 gauge spring-loaded device was used to perform biopsy of the RIGHT breast mass at the 9 o'clock axis, 1 cm from the nipple, using a lateral approach. At the conclusion of the procedure , a coil shaped tissue marker clip was deployed into the biopsy cavity. Follow up 2 view mammogram was performed for each breast and dictated separately. IMPRESSION: 1.  Ultrasound guided biopsy of the LEFT breast mass at the 3 o'clock axis, 5 cm from the nipple. No apparent complications. 2. Ultrasound guided biopsy of the RIGHT breast mass at the 9 o'clock axis, 1 cm from the nipple. No apparent complications. Electronically Signed: By: Bary Richard M.D. On: 01/07/2023 14:39  [place] LT BREAST BX W LOC DEV 1ST LESION IMG BX SPEC US GUIDE  Addendum Date: 01/09/2023   ADDENDUM REPORT: 01/09/2023 10:46 ADDENDUM: Pathology revealed FRAGMENTS OF A SCLEROSED INTRADUCTAL PAPILLOMA, FIBROCYSTIC CHANGES INCLUDING CYSTIC DILATATION OF DUCTS, APOCRINE METAPLASIA AND USUAL DUCT HYPERPLASIA, FOCAL FIBROMATOID CHANGE, MICROCALCIFICATIONS PRESENT WITHIN USUAL DUCT HYPERPLASIA of the LEFT breast, 3 o'clock, 5 cmfn, (venus clip). This was found to be concordant by Dr. Bary Richard with surgical consultation for excision recommended, per [facility] Breast Working Group protocol. Pathology revealed FRAGMENTS OF A SCLEROSED PAPILLOMA, FIBROCYSTIC CHANGES INCLUDING CYSTIC DILATATION OF DUCTS, APOCRINE METAPLASIA AND USUAL DUCT HYPERPLASIA of the RIGHT breast, 9 o'clock, 1 cmfn, (coil clip). This was found to be concordant by Dr. Bary Richard, with surgical consultation for excision recommended, per [facility] Breast Working Group protocol. Pathology results were discussed with the patient by telephone. The patient reported doing well after the biopsies with tenderness and swelling at the sites. Post biopsy instructions and care were reviewed, and questions were answered. The patient was encouraged to call [facility] for any additional concerns. My direct phone number was provided. Surgical consultation has been arranged with Dr. Manus Rudd at [facility] on February 03, 2023. Pathology results reported by Rene Kocher, RN on 01/09/2023. Electronically Signed   By: Bary Richard M.D.   On: 01/09/2023 10:46   Result Date: 01/09/2023 CLINICAL DATA:  Patient  presents for ultrasound biopsies of bilateral intraductal masses. EXAM: ULTRASOUND GUIDED BILATERAL BREAST CORE NEEDLE BIOPSY COMPARISON:  Previous exam(s). PROCEDURE: I met with the patient and we discussed the procedure of ultrasound-guided biopsy, including benefits and alternatives. We discussed the high likelihood of a successful procedure. We discussed the risks of the procedure, including infection, bleeding, tissue injury, clip migration, and inadequate sampling. Informed written consent was given. The usual time-out protocol was performed immediately prior to the procedure. Site 1: Lesion quadrant: Outer Using sterile technique and 1% Lidocaine as local anesthetic, under direct ultrasound visualization, a 12 gauge spring-loaded device was used to perform biopsy of the LEFT breast mass at the 3 o'clock axis, 5 cm from the nipple, using a lateral approach. At the conclusion of the procedure, a venus tissue marker clip was deployed into the biopsy cavity. Site 2: Lesion quadrant: Outer, retroareolar Using sterile technique and 1% Lidocaine as local anesthetic, under direct ultrasound visualization, a 12 gauge spring-loaded device was used to perform biopsy of the RIGHT breast mass at the 9 o'clock axis, 1 cm from the nipple, using a lateral approach. At the conclusion of the procedure , a coil shaped tissue marker clip was deployed into the biopsy cavity. Follow up 2 view mammogram was performed for each breast and dictated separately. IMPRESSION: 1. Ultrasound guided biopsy of the LEFT breast mass at the 3 o'clock axis, 5 cm from the nipple. No apparent complications. 2. Ultrasound guided biopsy of the RIGHT breast mass at the 9 o'clock axis, 1 cm from the nipple. No apparent complications. Electronically Signed: By: Bary Richard M.D. On: 01/07/2023 14:39  MM CLIP PLACEMENT RIGHT  Result Date: 01/07/2023 CLINICAL DATA:  Status post bilateral ultrasound-guided breast biopsies. EXAM: 3D DIAGNOSTIC  BILATERAL MAMMOGRAM POST ULTRASOUND BIOPSY COMPARISON:  Previous exam(s). FINDINGS: 3D Mammographic images were obtained following ultrasound guided biopsy of the LEFT breast mass at the 3 o'clock axis and ultrasound-guided biopsy of the RIGHT breast mass at the 9 o'clock axis. The biopsy marking clips are expected position at the sites of biopsy. IMPRESSION: 1. Appropriate positioning of the venous biopsy marking clip at the site of biopsy in the outer LEFT breast. 2. Appropriate positioning of the coil shaped biopsy marking clip at the site of biopsy in the outer RIGHT breast. Final Assessment: Post Procedure Mammograms for Marker Placement Electronically Signed   By: Bary Richard M.D.   On: 01/07/2023 14:57  MM CLIP PLACEMENT LEFT  Result Date: 01/07/2023 CLINICAL DATA:  Status post bilateral ultrasound-guided breast biopsies. EXAM: 3D DIAGNOSTIC BILATERAL MAMMOGRAM POST ULTRASOUND BIOPSY COMPARISON:  Previous exam(s). FINDINGS: 3D Mammographic images were obtained following ultrasound guided biopsy of the LEFT breast mass at the 3 o'clock axis and ultrasound-guided biopsy of the RIGHT breast mass at the 9 o'clock axis. The biopsy marking clips are expected position at the sites of biopsy. IMPRESSION: 1. Appropriate positioning of the venous biopsy marking clip at the site of biopsy in the outer LEFT breast. 2. Appropriate positioning of the coil shaped biopsy marking clip at the site of biopsy in the outer RIGHT breast. Final Assessment: Post Procedure Mammograms for Marker Placement Electronically Signed   By: Bary Richard M.D.   On: 01/07/2023 14:57   Recent Results (from the past 2160 hour(s))  POC  COVID-19     Status: None   Collection Time: 03/26/23  1:36 PM  Result Value Ref Range   SARS Coronavirus 2 Ag Negative Negative        Garner Nash, MD, MS

## 2023-03-26 NOTE — Assessment & Plan Note (Addendum)
Improved plan: Continue Chantix 1 mg twice daily. Introduce nicotine gum for additional support. defer PFT until next visit due to likely URI

## 2023-03-26 NOTE — Assessment & Plan Note (Signed)
Await Medicaid authorization. Follow-up on surgery scheduling.

## 2023-03-26 NOTE — Assessment & Plan Note (Signed)
Recommend follow-up with sports medicine

## 2023-03-26 NOTE — Assessment & Plan Note (Signed)
Recommend supportive care OTC.  Concern for underlying COPD due to smoking history.  Continue to monitor symptoms and concussions discussed.

## 2023-03-27 ENCOUNTER — Ambulatory Visit: Payer: Commercial Managed Care - HMO | Admitting: Family Medicine

## 2023-03-27 LAB — HIV ANTIBODY (ROUTINE TESTING W REFLEX): HIV 1&2 Ab, 4th Generation: NONREACTIVE

## 2023-03-27 LAB — HEPATITIS C ANTIBODY: Hepatitis C Ab: NONREACTIVE

## 2023-04-14 DIAGNOSIS — Z419 Encounter for procedure for purposes other than remedying health state, unspecified: Secondary | ICD-10-CM | POA: Diagnosis not present

## 2023-04-16 ENCOUNTER — Other Ambulatory Visit: Payer: Self-pay | Admitting: Surgery

## 2023-04-16 DIAGNOSIS — D241 Benign neoplasm of right breast: Secondary | ICD-10-CM

## 2023-04-30 NOTE — Pre-Procedure Instructions (Signed)
Surgical Instructions   Your procedure is scheduled on Wednesday, October 23rd. Report to [facility] Main Entrance "A" at 07:30 A.M., then check in with the Admitting office. Any questions or running late day of surgery: call [phone]  Questions prior to your surgery date: call [phone], Monday-Friday, 8am-4pm. If you experience any cold or flu symptoms such as cough, fever, chills, shortness of breath, etc. between now and your scheduled surgery, please notify us at the above number.     Remember:  Do not eat after midnight the night before your surgery   You may drink clear liquids until 06:30 AM the morning of your surgery.   Clear liquids allowed are: Water, Non-Citrus Juices (without pulp), Carbonated Beverages, Clear Tea, Black Coffee Only (NO MILK, CREAM OR POWDERED CREAMER of any kind), and Gatorade.  Patient Instructions  The night before surgery:  No food after midnight. ONLY clear liquids after midnight  The day of surgery (if you do NOT have diabetes):  Drink ONE (1) Pre-Surgery Clear Ensure by 06:30 AM the morning of surgery. Drink in one sitting. Do not sip.  This drink was given to you during your hospital  pre-op appointment visit.  Nothing else to drink after completing the  Pre-Surgery Clear Ensure.         If you have questions, please contact your surgeon's office.    Take these medicines the morning of surgery with A SIP OF WATER  May take these medicines IF NEEDED: albuterol (VENTOLIN HFA)- bring inhaler with you on day of surgery   One week prior to surgery, STOP taking any Aspirin (unless otherwise instructed by your surgeon) Aleve, Naproxen, Ibuprofen, Motrin, Advil, Goody's, BC's, all herbal medications, fish oil, and non-prescription vitamins.                     Do NOT Smoke (Tobacco/Vaping) for 24 hours prior to your procedure.  If you use a CPAP at night, you may bring your mask/headgear for your overnight stay.   You will be asked to  remove any contacts, glasses, piercing's, hearing aid's, dentures/partials prior to surgery. Please bring cases for these items if needed.    Patients discharged the day of surgery will not be allowed to drive home, and someone needs to stay with them for 24 hours.  SURGICAL WAITING ROOM VISITATION Patients may have no more than 2 support people in the waiting area - these visitors may rotate.   Pre-op nurse will coordinate an appropriate time for 1 ADULT support person, who may not rotate, to accompany patient in pre-op.  Children under the age of 34 must have an adult with them who is not the patient and must remain in the main waiting area with an adult.  If the patient needs to stay at the hospital during part of their recovery, the visitor guidelines for inpatient rooms apply.  Please refer to the [facility] website for the visitor guidelines for any additional information.   If you received a COVID test during your pre-op visit  it is requested that you wear a mask when out in public, stay away from anyone that may not be feeling well and notify your surgeon if you develop symptoms. If you have been in contact with anyone that has tested positive in the last 10 days please notify you surgeon.      Pre-operative CHG Bathing Instructions   You can play a key role in reducing the risk of infection after  surgery. Your skin needs to be as free of germs as possible. You can reduce the number of germs on your skin by washing with CHG (chlorhexidine gluconate) soap before surgery. CHG is an antiseptic soap that kills germs and continues to kill germs even after washing.   DO NOT use if you have an allergy to chlorhexidine/CHG or antibacterial soaps. If your skin becomes reddened or irritated, stop using the CHG and notify one of our RNs at [phone].              TAKE A SHOWER THE NIGHT BEFORE SURGERY AND THE DAY OF SURGERY    Please keep in mind the following:  DO NOT shave, including  legs and underarms, 48 hours prior to surgery.   You may shave your face before/day of surgery.  Place clean sheets on your bed the night before surgery Use a clean washcloth (not used since being washed) for each shower. DO NOT sleep with pet's night before surgery.  CHG Shower Instructions:  Wash your face and private area with normal soap. If you choose to wash your hair, wash first with your normal shampoo.  After you use shampoo/soap, rinse your hair and body thoroughly to remove shampoo/soap residue.  Turn the water OFF and apply half the bottle of CHG soap to a CLEAN washcloth.  Apply CHG soap ONLY FROM YOUR NECK DOWN TO YOUR TOES (washing for 3-5 minutes)  DO NOT use CHG soap on face, private areas, open wounds, or sores.  Pay special attention to the area where your surgery is being performed.  If you are having back surgery, having someone wash your back for you may be helpful. Wait 2 minutes after CHG soap is applied, then you may rinse off the CHG soap.  Pat dry with a clean towel  Put on clean pajamas    Additional instructions for the day of surgery: DO NOT APPLY any lotions, deodorants, cologne, or perfumes.   Do not wear jewelry or makeup Do not wear nail polish, gel polish, artificial nails, or any other type of covering on natural nails (fingers and toes) Do not bring valuables to the hospital. [facility] is not responsible for valuables/personal belongings. Put on clean/comfortable clothes.  Please brush your teeth.  Ask your nurse before applying any prescription medications to the skin.

## 2023-05-01 ENCOUNTER — Inpatient Hospital Stay (HOSPITAL_COMMUNITY)
Admission: RE | Admit: 2023-05-01 | Discharge: 2023-05-01 | Disposition: A | Discharge status: Home / Self Care | Payer: 59 | Source: Ambulatory Visit

## 2023-05-01 NOTE — Progress Notes (Signed)
Pt did not show for PAT appt at 1000. Pt was rescheduled for 1430 and did not show up again. Chart given to [facility], scheduler.

## 2023-05-15 DIAGNOSIS — Z419 Encounter for procedure for purposes other than remedying health state, unspecified: Secondary | ICD-10-CM | POA: Diagnosis not present

## 2023-05-21 ENCOUNTER — Ambulatory Visit: Payer: Self-pay | Admitting: Surgery

## 2023-05-21 NOTE — H&P (View-Only) (Signed)
 Subjective    Chief Complaint: New Consultation (BILATERAL Intraductal papillomas)       History of Present Illness: Katherine Ramirez is a 57 y.o. female who is seen today as an office consultation at the request of Dr. Janee Morn for evaluation of New Consultation (BILATERAL Intraductal papillomas) .     This is a 57 year old female who presents with recent left breast yellowish discharge and some intermittent left breast pain.  No previous breast problems.  No family history of breast cancer.  She underwent workup including bilateral diagnostic mammograms and bilateral ultrasounds.  In the right breast at 9:00 located 1 cm from the nipple there is an 8 x 8 x 4 mm mass.  In the left breast at 3:00 located 5 cm from the nipple there is another subcentimeter mass.  Both of these were biopsied and revealed a sclerosed intraductal papillomas.  No atypia was seen.  She is referred to discuss excision.  She states that the discharge has stopped from the left nipple.  The intermittent pain that she feels corresponds to the area where she had the biopsy in the lateral left breast.     Review of Systems: A complete review of systems was obtained from the patient.  I have reviewed this information and discussed as appropriate with the patient.  See HPI as well for other ROS.   Review of Systems  Constitutional: Negative.   HENT: Negative.    Eyes: Negative.   Respiratory: Negative.    Cardiovascular: Negative.   Gastrointestinal: Negative.   Genitourinary: Negative.   Musculoskeletal:  Positive for neck pain.  Skin: Negative.   Neurological: Negative.   Endo/Heme/Allergies:  Bruises/bleeds easily.  Psychiatric/Behavioral:  Positive for depression.         Medical History: Past Medical History         Past Medical History:  Diagnosis Date   Arthritis     Asthma, unspecified asthma severity, unspecified whether complicated, unspecified whether persistent (HHS-HCC)     GERD (gastroesophageal  reflux disease)     Hyperlipidemia     Hypertension          Problem List       Patient Active Problem List  Diagnosis   Tobacco use disorder        Past Surgical History           Past Surgical History:  Procedure Laterality Date   APPENDECTOMY       HERNIA REPAIR            Allergies  No Known Allergies      Medications Ordered Prior to Encounter             Current Outpatient Medications on File Prior to Visit  Medication Sig Dispense Refill   methocarbamoL (ROBAXIN) 500 MG tablet          No current facility-administered medications on file prior to visit.        Family History           Family History  Problem Relation Age of Onset   Coronary Artery Disease (Blocked arteries around heart) Mother     High blood pressure (Hypertension) Mother     Hyperlipidemia (Elevated cholesterol) Mother     Coronary Artery Disease (Blocked arteries around heart) Father          Tobacco Use History  Social History           Tobacco Use  Smoking Status Every Day  Types: Cigarettes  Smokeless Tobacco Not on file        Social History  Social History             Socioeconomic History   Marital status: Widowed  Tobacco Use   Smoking status: Every Day      Types: Cigarettes  Vaping Use   Vaping status: Unknown  Substance and Sexual Activity   Alcohol use: Yes   Drug use: Never    Social Determinants of Health           Financial Resource Strain: High Risk (11/30/2022)    Received from [facility]    Overall Financial Resource Strain (CARDIA)     Difficulty of Paying Living Expenses: Very hard  Food Insecurity: Food Insecurity Present (11/30/2022)    Received from [facility]    Hunger Vital Sign     Worried About Running Out of Food in the Last Year: Often true     Ran Out of Food in the Last Year: Often true  Transportation Needs: Unmet Transportation Needs (11/30/2022)    Received from [facility] - [facility]     Lack of  Transportation (Medical): Yes     Lack of Transportation (Non-Medical): Yes  Physical Activity: Insufficiently Active (11/30/2022)    Received from [facility]    Exercise Vital Sign     Days of Exercise per Week: 2 days     Minutes of Exercise per Session: 20 min  Stress: Stress Concern Present (11/30/2022)    Received from [facility] - Occupational Stress Questionnaire     Feeling of Stress : To some extent  Social Connections: Moderately Integrated (11/30/2022)    Received from [facility]    Social Connection and Isolation Panel [NHANES]     Frequency of Communication with Friends and Family: Once a week     Frequency of Social Gatherings with Friends and Family: Twice a week     Attends Religious Services: More than 4 times per year     Active Member of [employer] or Organizations: Yes     Attends Banker Meetings: More than 4 times per year     Marital Status: Widowed        Objective:           Vitals:    02/03/23 1405  BP: (!) 147/85  Pulse: 66  Temp: 36.4 C (97.6 F)  SpO2: (!) 66%  Weight: 70.5 kg (155 lb 6.4 oz)  Height: 160 cm (5\' 3" )  PainSc: 0-No pain    Body mass index is 27.53 kg/m.   Physical Exam    Constitutional:  WDWN in NAD, conversant, no obvious deformities; lying in bed comfortably Eyes:  Pupils equal, round; sclera anicteric; moist conjunctiva; no lid lag HENT:  Oral mucosa moist; good dentition  Neck:  No masses palpated, trachea midline; no thyromegaly Lungs:  CTA bilaterally; normal respiratory effort Breasts:  symmetric, no nipple changes; no palpable masses or lymphadenopathy on either side; no nipple discharge.  Slight tenderness in the lateral left breast CV:  Regular rate and rhythm; no murmurs; extremities well-perfused with no edema Abd:  +bowel sounds, soft, non-tender, no palpable organomegaly; no palpable hernias Musc:  Normal gait; no apparent clubbing or cyanosis in  extremities Lymphatic:  No palpable cervical or axillary lymphadenopathy Skin:  Warm, dry; no sign of jaundice Psychiatric - alert and oriented x 4; calm  mood and affect     Labs, Imaging and Diagnostic Testing: Diagnosis 1. Breast, left, needle core biopsy, 3 o'clock, 5 cmfn FRAGMENTS OF A SCLEROSED INTRADUCTAL PAPILLOMA FIBROCYSTIC CHANGES INCLUDING CYSTIC DILATATION OF DUCTS, APOCRINE METAPLASIA AND USUAL DUCT HYPERPLASIA FOCAL FIBROMATOID CHANGE MICROCALCIFICATIONS PRESENT WITHIN USUAL DUCT HYPERPLASIA NEGATIVE FOR ATYPIA AND CARCINOMA 2. Breast, right, needle core biopsy, 9 o'clock, 1 cmfn FRAGMENTS OF A SCLEROSED PAPILLOMA FIBROCYSTIC CHANGES INCLUDING CYSTIC DILATATION OF DUCTS, APOCRINE METAPLASIA AND USUAL DUCT HYPERPLASIA NEGATIVE FOR MICROCALCIFICATIONS NEGATIVE FOR ATYPIA AND CARCINOMA Diagnosis Note 1. -2. Although no atypia is seen within these fragments of papilloma, a complete excision is recommended to exclude unsampled atypia or malignancy. Diagnosis called to Nickie at [facility] by Dr. Venetia Night on 01/08/2023 at 9:42 AM. Jerene Bears MD Pathologist, Electronic Signature (Case signed 01/08/2023)     CLINICAL DATA:  The patient presents with nonfocal lateral left breast pain and creamy spontaneous single duct left-sided nipple discharge.   EXAM: DIGITAL DIAGNOSTIC BILATERAL MAMMOGRAM WITH TOMOSYNTHESIS; ULTRASOUND LEFT BREAST LIMITED; ULTRASOUND RIGHT BREAST LIMITED   TECHNIQUE: Bilateral digital diagnostic mammography and breast tomosynthesis was performed.; Targeted ultrasound examination of the left breast was performed.; Targeted ultrasound examination of the right breast was performed   COMPARISON:  Previous exam(s).   ACR Breast Density Category b: There are scattered areas of fibroglandular density.   FINDINGS: Two masses are identified in the lateral right breast. There is a mass at an anterior to mid depth  which may be associated with a milk duct. The other mass is located laterally and more posteriorly. No other suspicious findings on the right.   There also appears to be an obscured mass in the lateral left breast at a posterior depth. No other suspicious findings on the left.   Targeted ultrasound is performed, showing an apparent intraductal mass on the right at 9 o'clock, 1 cm from the nipple measuring 8 by 8 x 4 mm. The mass is oval, hypoechoic, and circumscribed with parallel orientation and increased through transmission. There is also a simple cyst containing a layering calcification at 9 o'clock, 4 cm from the nipple correlating with the other right-sided mass. Axillary adenopathy.   No abnormalities are identified in the retroareolar region on the left. There appears to be an intraductal mass in the left breast at 3 o'clock, 5 cm from the nipple likely correlating with the obscured mass in this location on recent mammography. This mass is oval, hypo echoic, and circumscribed with parallel orientation and increased through transmission.   No axillary adenopathy.   IMPRESSION: Bilateral intraductal masses, most likely papillomas. No other suspicious findings.   RECOMMENDATION: Recommend ultrasound-guided biopsy of the bilateral intraductal masses.   I have discussed the findings and recommendations with the patient. If applicable, a reminder letter will be sent to the patient regarding the next appointment.   BI-RADS CATEGORY  4: Suspicious.     Electronically Signed   By: Gerome Sam III M.D.   On: 12/26/2022 14:08     Assessment and Plan:  Diagnoses and all orders for this visit:   Intraductal papilloma of both breasts     Bilateral radioactive seed localized lumpectomies.  The surgical procedure has been discussed with the patient.  Potential risks, benefits, alternative treatments, and expected outcomes have been explained.  All of the patient's  questions at this time have been answered.  The likelihood of reaching the patient's treatment goal is good.  The patient understand  the proposed surgical procedure and wishes to proceed.   Wilmon Arms. Corliss Skains, MD, [facility]  General Surgery   05/21/2023 1:56 PM

## 2023-05-21 NOTE — H&P (Signed)
Subjective    Chief Complaint: New Consultation (BILATERAL Intraductal papillomas)       History of Present Illness: Katherine Ramirez is a 57 y.o. female who is seen today as an office consultation at the request of Dr. Janee Morn for evaluation of New Consultation (BILATERAL Intraductal papillomas) .     This is a 57 year old female who presents with recent left breast yellowish discharge and some intermittent left breast pain.  No previous breast problems.  No family history of breast cancer.  She underwent workup including bilateral diagnostic mammograms and bilateral ultrasounds.  In the right breast at 9:00 located 1 cm from the nipple there is an 8 x 8 x 4 mm mass.  In the left breast at 3:00 located 5 cm from the nipple there is another subcentimeter mass.  Both of these were biopsied and revealed a sclerosed intraductal papillomas.  No atypia was seen.  She is referred to discuss excision.  She states that the discharge has stopped from the left nipple.  The intermittent pain that she feels corresponds to the area where she had the biopsy in the lateral left breast.     Review of Systems: A complete review of systems was obtained from the patient.  I have reviewed this information and discussed as appropriate with the patient.  See HPI as well for other ROS.   Review of Systems  Constitutional: Negative.   HENT: Negative.    Eyes: Negative.   Respiratory: Negative.    Cardiovascular: Negative.   Gastrointestinal: Negative.   Genitourinary: Negative.   Musculoskeletal:  Positive for neck pain.  Skin: Negative.   Neurological: Negative.   Endo/Heme/Allergies:  Bruises/bleeds easily.  Psychiatric/Behavioral:  Positive for depression.         Medical History: Past Medical History         Past Medical History:  Diagnosis Date   Arthritis     Asthma, unspecified asthma severity, unspecified whether complicated, unspecified whether persistent (HHS-HCC)     GERD (gastroesophageal  reflux disease)     Hyperlipidemia     Hypertension          Problem List       Patient Active Problem List  Diagnosis   Tobacco use disorder        Past Surgical History           Past Surgical History:  Procedure Laterality Date   APPENDECTOMY       HERNIA REPAIR            Allergies  No Known Allergies      Medications Ordered Prior to Encounter             Current Outpatient Medications on File Prior to Visit  Medication Sig Dispense Refill   methocarbamoL (ROBAXIN) 500 MG tablet          No current facility-administered medications on file prior to visit.        Family History           Family History  Problem Relation Age of Onset   Coronary Artery Disease (Blocked arteries around heart) Mother     High blood pressure (Hypertension) Mother     Hyperlipidemia (Elevated cholesterol) Mother     Coronary Artery Disease (Blocked arteries around heart) Father          Tobacco Use History  Social History           Tobacco Use  Smoking Status Every Day  Types: Cigarettes  Smokeless Tobacco Not on file        Social History  Social History             Socioeconomic History   Marital status: Widowed  Tobacco Use   Smoking status: Every Day      Types: Cigarettes  Vaping Use   Vaping status: Unknown  Substance and Sexual Activity   Alcohol use: Yes   Drug use: Never    Social Determinants of Health           Financial Resource Strain: High Risk (11/30/2022)    Received from [facility]    Overall Financial Resource Strain (CARDIA)     Difficulty of Paying Living Expenses: Very hard  Food Insecurity: Food Insecurity Present (11/30/2022)    Received from [facility]    Hunger Vital Sign     Worried About Running Out of Food in the Last Year: Often true     Ran Out of Food in the Last Year: Often true  Transportation Needs: Unmet Transportation Needs (11/30/2022)    Received from [facility]     Lack of  Transportation (Medical): Yes     Lack of Transportation (Non-Medical): Yes  Physical Activity: Insufficiently Active (11/30/2022)    Received from [facility]    Exercise Vital Sign     Days of Exercise per Week: 2 days     Minutes of Exercise per Session: 20 min  Stress: Stress Concern Present (11/30/2022)    Received from [facility] - Occupational Stress Questionnaire     Feeling of Stress : To some extent  Social Connections: Moderately Integrated (11/30/2022)    Received from [facility]    Social Connection and Isolation Panel [NHANES]     Frequency of Communication with Friends and Family: Once a week     Frequency of Social Gatherings with Friends and Family: Twice a week     Attends Religious Services: More than 4 times per year     Active Member of [employer] or Organizations: Yes     Attends Banker Meetings: More than 4 times per year     Marital Status: Widowed        Objective:           Vitals:    02/03/23 1405  BP: (!) 147/85  Pulse: 66  Temp: 36.4 C (97.6 F)  SpO2: (!) 66%  Weight: 70.5 kg (155 lb 6.4 oz)  Height: 160 cm (5\' 3" )  PainSc: 0-No pain    Body mass index is 27.53 kg/m.   Physical Exam    Constitutional:  WDWN in NAD, conversant, no obvious deformities; lying in bed comfortably Eyes:  Pupils equal, round; sclera anicteric; moist conjunctiva; no lid lag HENT:  Oral mucosa moist; good dentition  Neck:  No masses palpated, trachea midline; no thyromegaly Lungs:  CTA bilaterally; normal respiratory effort Breasts:  symmetric, no nipple changes; no palpable masses or lymphadenopathy on either side; no nipple discharge.  Slight tenderness in the lateral left breast CV:  Regular rate and rhythm; no murmurs; extremities well-perfused with no edema Abd:  +bowel sounds, soft, non-tender, no palpable organomegaly; no palpable hernias Musc:  Normal gait; no apparent clubbing or cyanosis in  extremities Lymphatic:  No palpable cervical or axillary lymphadenopathy Skin:  Warm, dry; no sign of jaundice Psychiatric - alert and oriented x 4; calm  mood and affect     Labs, Imaging and Diagnostic Testing: Diagnosis 1. Breast, left, needle core biopsy, 3 o'clock, 5 cmfn FRAGMENTS OF A SCLEROSED INTRADUCTAL PAPILLOMA FIBROCYSTIC CHANGES INCLUDING CYSTIC DILATATION OF DUCTS, APOCRINE METAPLASIA AND USUAL DUCT HYPERPLASIA FOCAL FIBROMATOID CHANGE MICROCALCIFICATIONS PRESENT WITHIN USUAL DUCT HYPERPLASIA NEGATIVE FOR ATYPIA AND CARCINOMA 2. Breast, right, needle core biopsy, 9 o'clock, 1 cmfn FRAGMENTS OF A SCLEROSED PAPILLOMA FIBROCYSTIC CHANGES INCLUDING CYSTIC DILATATION OF DUCTS, APOCRINE METAPLASIA AND USUAL DUCT HYPERPLASIA NEGATIVE FOR MICROCALCIFICATIONS NEGATIVE FOR ATYPIA AND CARCINOMA Diagnosis Note 1. -2. Although no atypia is seen within these fragments of papilloma, a complete excision is recommended to exclude unsampled atypia or malignancy. Diagnosis called to Katherine Ramirez at [facility] by Dr. Venetia Night on 01/08/2023 at 9:42 AM. Jerene Bears MD Pathologist, Electronic Signature (Case signed 01/08/2023)     CLINICAL DATA:  The patient presents with nonfocal lateral left breast pain and creamy spontaneous single duct left-sided nipple discharge.   EXAM: DIGITAL DIAGNOSTIC BILATERAL MAMMOGRAM WITH TOMOSYNTHESIS; ULTRASOUND LEFT BREAST LIMITED; ULTRASOUND RIGHT BREAST LIMITED   TECHNIQUE: Bilateral digital diagnostic mammography and breast tomosynthesis was performed.; Targeted ultrasound examination of the left breast was performed.; Targeted ultrasound examination of the right breast was performed   COMPARISON:  Previous exam(s).   ACR Breast Density Category b: There are scattered areas of fibroglandular density.   FINDINGS: Two masses are identified in the lateral right breast. There is a mass at an anterior to mid depth  which may be associated with a milk duct. The other mass is located laterally and more posteriorly. No other suspicious findings on the right.   There also appears to be an obscured mass in the lateral left breast at a posterior depth. No other suspicious findings on the left.   Targeted ultrasound is performed, showing an apparent intraductal mass on the right at 9 o'clock, 1 cm from the nipple measuring 8 by 8 x 4 mm. The mass is oval, hypoechoic, and circumscribed with parallel orientation and increased through transmission. There is also a simple cyst containing a layering calcification at 9 o'clock, 4 cm from the nipple correlating with the other right-sided mass. Axillary adenopathy.   No abnormalities are identified in the retroareolar region on the left. There appears to be an intraductal mass in the left breast at 3 o'clock, 5 cm from the nipple likely correlating with the obscured mass in this location on recent mammography. This mass is oval, hypo echoic, and circumscribed with parallel orientation and increased through transmission.   No axillary adenopathy.   IMPRESSION: Bilateral intraductal masses, most likely papillomas. No other suspicious findings.   RECOMMENDATION: Recommend ultrasound-guided biopsy of the bilateral intraductal masses.   I have discussed the findings and recommendations with the patient. If applicable, a reminder letter will be sent to the patient regarding the next appointment.   BI-RADS CATEGORY  4: Suspicious.     Electronically Signed   By: Gerome Sam III M.D.   On: 12/26/2022 14:08     Assessment and Plan:  Diagnoses and all orders for this visit:   Intraductal papilloma of both breasts     Bilateral radioactive seed localized lumpectomies.  The surgical procedure has been discussed with the patient.  Potential risks, benefits, alternative treatments, and expected outcomes have been explained.  All of the patient's  questions at this time have been answered.  The likelihood of reaching the patient's treatment goal is good.  The patient understand  the proposed surgical procedure and wishes to proceed.   Wilmon Arms. Corliss Skains, MD, [facility]  General Surgery   05/21/2023 1:56 PM

## 2023-05-26 ENCOUNTER — Encounter (HOSPITAL_BASED_OUTPATIENT_CLINIC_OR_DEPARTMENT_OTHER): Payer: Self-pay | Admitting: Surgery

## 2023-05-26 ENCOUNTER — Other Ambulatory Visit: Payer: Self-pay

## 2023-06-01 MED ORDER — CHLORHEXIDINE GLUCONATE CLOTH 2 % EX PADS: 6.0000 | MEDICATED_PAD | Freq: Once | CUTANEOUS | Status: DC

## 2023-06-01 NOTE — Progress Notes (Signed)

## 2023-06-02 ENCOUNTER — Ambulatory Visit
Admission: RE | Admit: 2023-06-02 | Discharge: 2023-06-02 | Disposition: A | Discharge status: Home / Self Care | Payer: Commercial Managed Care - HMO | Source: Ambulatory Visit | Attending: Surgery | Admitting: Surgery

## 2023-06-02 ENCOUNTER — Ambulatory Visit
Admission: RE | Admit: 2023-06-02 | Discharge: 2023-06-02 | Disposition: A | Discharge status: Home / Self Care | Payer: 59 | Source: Ambulatory Visit | Attending: Surgery | Admitting: Surgery

## 2023-06-02 DIAGNOSIS — D242 Benign neoplasm of left breast: Secondary | ICD-10-CM

## 2023-06-02 DIAGNOSIS — D241 Benign neoplasm of right breast: Secondary | ICD-10-CM

## 2023-06-02 HISTORY — PX: BREAST BIOPSY: SHX20

## 2023-06-03 ENCOUNTER — Encounter (HOSPITAL_BASED_OUTPATIENT_CLINIC_OR_DEPARTMENT_OTHER): Payer: Self-pay | Admitting: Surgery

## 2023-06-03 ENCOUNTER — Ambulatory Visit (HOSPITAL_BASED_OUTPATIENT_CLINIC_OR_DEPARTMENT_OTHER): Payer: 59 | Admitting: Anesthesiology

## 2023-06-03 ENCOUNTER — Ambulatory Visit
Admission: RE | Admit: 2023-06-03 | Discharge: 2023-06-03 | Disposition: A | Discharge status: Home / Self Care | Payer: Commercial Managed Care - HMO | Source: Ambulatory Visit | Attending: Surgery | Admitting: Surgery

## 2023-06-03 ENCOUNTER — Encounter (HOSPITAL_BASED_OUTPATIENT_CLINIC_OR_DEPARTMENT_OTHER)
Admission: RE | Disposition: A | Discharge status: Home / Self Care | Payer: Self-pay | Source: Home / Self Care | Attending: Surgery

## 2023-06-03 ENCOUNTER — Ambulatory Visit (HOSPITAL_BASED_OUTPATIENT_CLINIC_OR_DEPARTMENT_OTHER)
Admission: RE | Admit: 2023-06-03 | Discharge: 2023-06-03 | Disposition: A | Discharge status: Home / Self Care | Payer: 59 | Attending: Surgery | Admitting: Surgery

## 2023-06-03 ENCOUNTER — Other Ambulatory Visit: Payer: Self-pay

## 2023-06-03 DIAGNOSIS — N6042 Mammary duct ectasia of left breast: Secondary | ICD-10-CM | POA: Diagnosis not present

## 2023-06-03 DIAGNOSIS — F17210 Nicotine dependence, cigarettes, uncomplicated: Secondary | ICD-10-CM | POA: Insufficient documentation

## 2023-06-03 DIAGNOSIS — I10 Essential (primary) hypertension: Secondary | ICD-10-CM | POA: Diagnosis not present

## 2023-06-03 DIAGNOSIS — D241 Benign neoplasm of right breast: Secondary | ICD-10-CM

## 2023-06-03 DIAGNOSIS — N6082 Other benign mammary dysplasias of left breast: Secondary | ICD-10-CM | POA: Insufficient documentation

## 2023-06-03 DIAGNOSIS — N6022 Fibroadenosis of left breast: Secondary | ICD-10-CM | POA: Diagnosis not present

## 2023-06-03 DIAGNOSIS — N6021 Fibroadenosis of right breast: Secondary | ICD-10-CM | POA: Diagnosis not present

## 2023-06-03 DIAGNOSIS — N6081 Other benign mammary dysplasias of right breast: Secondary | ICD-10-CM | POA: Insufficient documentation

## 2023-06-03 DIAGNOSIS — N6041 Mammary duct ectasia of right breast: Secondary | ICD-10-CM | POA: Diagnosis not present

## 2023-06-03 DIAGNOSIS — D242 Benign neoplasm of left breast: Secondary | ICD-10-CM | POA: Insufficient documentation

## 2023-06-03 HISTORY — PX: BREAST LUMPECTOMY WITH RADIOACTIVE SEED LOCALIZATION: SHX6424

## 2023-06-03 SURGERY — BREAST LUMPECTOMY WITH RADIOACTIVE SEED LOCALIZATION
Anesthesia: General | Site: Breast | Laterality: Bilateral

## 2023-06-03 MED ORDER — SODIUM CHLORIDE 0.9 % IV SOLN: 12.5000 mg | INTRAVENOUS | Status: DC | PRN

## 2023-06-03 MED ORDER — ACETAMINOPHEN 500 MG PO TABS
1000.0000 mg | ORAL_TABLET | Freq: Once | ORAL | Status: AC
  Administered 2023-06-03: 1000 mg via ORAL

## 2023-06-03 MED ORDER — CELECOXIB 200 MG PO CAPS
ORAL_CAPSULE | ORAL | Status: AC
  Filled 2023-06-03: qty 1

## 2023-06-03 MED ORDER — DEXAMETHASONE SODIUM PHOSPHATE 10 MG/ML IJ SOLN
INTRAMUSCULAR | Status: AC
  Filled 2023-06-03: qty 1

## 2023-06-03 MED ORDER — LIDOCAINE HCL (CARDIAC) PF 100 MG/5ML IV SOSY
PREFILLED_SYRINGE | INTRAVENOUS | Status: DC | PRN
  Administered 2023-06-03: 60 mg via INTRAVENOUS

## 2023-06-03 MED ORDER — BUPIVACAINE-EPINEPHRINE 0.25% -1:200000 IJ SOLN
INTRAMUSCULAR | Status: DC | PRN
  Administered 2023-06-03: 20 mL

## 2023-06-03 MED ORDER — FENTANYL CITRATE (PF) 100 MCG/2ML IJ SOLN
INTRAMUSCULAR | Status: DC | PRN
  Administered 2023-06-03: 100 ug via INTRAVENOUS
  Administered 2023-06-03 (×4): 25 ug via INTRAVENOUS

## 2023-06-03 MED ORDER — MIDAZOLAM HCL 2 MG/2ML IJ SOLN
INTRAMUSCULAR | Status: AC
  Filled 2023-06-03: qty 2

## 2023-06-03 MED ORDER — LACTATED RINGERS IV SOLN: INTRAVENOUS | Status: DC | PRN

## 2023-06-03 MED ORDER — SCOPOLAMINE 1 MG/3DAYS TD PT72
1.0000 | MEDICATED_PATCH | TRANSDERMAL | Status: DC
  Administered 2023-06-03: 1.5 mg via TRANSDERMAL

## 2023-06-03 MED ORDER — PROPOFOL 10 MG/ML IV BOLUS
INTRAVENOUS | Status: AC
  Filled 2023-06-03: qty 20

## 2023-06-03 MED ORDER — CEFAZOLIN SODIUM-DEXTROSE 2-4 GM/100ML-% IV SOLN: 2.0000 g | INTRAVENOUS | Status: DC

## 2023-06-03 MED ORDER — ONDANSETRON HCL 4 MG/2ML IJ SOLN
INTRAMUSCULAR | Status: AC
  Filled 2023-06-03: qty 2

## 2023-06-03 MED ORDER — EPHEDRINE SULFATE (PRESSORS) 50 MG/ML IJ SOLN
INTRAMUSCULAR | Status: DC | PRN
  Administered 2023-06-03 (×2): 10 mg via INTRAVENOUS

## 2023-06-03 MED ORDER — DEXMEDETOMIDINE HCL IN NACL 80 MCG/20ML IV SOLN
INTRAVENOUS | Status: DC | PRN
  Administered 2023-06-03: 12 ug via INTRAVENOUS
  Administered 2023-06-03: 8 ug via INTRAVENOUS

## 2023-06-03 MED ORDER — CELECOXIB 200 MG PO CAPS
200.0000 mg | ORAL_CAPSULE | Freq: Once | ORAL | Status: AC
Start: 2023-06-03 — End: 2023-06-03
  Administered 2023-06-03: 200 mg via ORAL

## 2023-06-03 MED ORDER — DEXAMETHASONE SODIUM PHOSPHATE 10 MG/ML IJ SOLN
INTRAMUSCULAR | Status: DC | PRN
  Administered 2023-06-03: 5 mg via INTRAVENOUS

## 2023-06-03 MED ORDER — PROPOFOL 10 MG/ML IV BOLUS
INTRAVENOUS | Status: DC | PRN
  Administered 2023-06-03: 200 mg via INTRAVENOUS

## 2023-06-03 MED ORDER — FENTANYL CITRATE (PF) 100 MCG/2ML IJ SOLN: 25.0000 ug | INTRAMUSCULAR | Status: DC | PRN

## 2023-06-03 MED ORDER — OXYCODONE HCL 5 MG PO TABS: 5.0000 mg | ORAL_TABLET | Freq: Once | ORAL | Status: DC | PRN

## 2023-06-03 MED ORDER — CEFAZOLIN SODIUM-DEXTROSE 2-4 GM/100ML-% IV SOLN
INTRAVENOUS | Status: AC
  Filled 2023-06-03: qty 100

## 2023-06-03 MED ORDER — ACETAMINOPHEN 500 MG PO TABS: 1000.0000 mg | ORAL_TABLET | ORAL | Status: DC

## 2023-06-03 MED ORDER — FENTANYL CITRATE (PF) 100 MCG/2ML IJ SOLN
INTRAMUSCULAR | Status: AC
  Filled 2023-06-03: qty 2

## 2023-06-03 MED ORDER — ONDANSETRON HCL 4 MG/2ML IJ SOLN
INTRAMUSCULAR | Status: DC | PRN
  Administered 2023-06-03: 4 mg via INTRAVENOUS

## 2023-06-03 MED ORDER — LACTATED RINGERS IV SOLN: INTRAVENOUS | Status: DC

## 2023-06-03 MED ORDER — AMISULPRIDE (ANTIEMETIC) 5 MG/2ML IV SOLN: 10.0000 mg | Freq: Once | INTRAVENOUS | Status: DC | PRN

## 2023-06-03 MED ORDER — ACETAMINOPHEN 500 MG PO TABS
ORAL_TABLET | ORAL | Status: AC
  Filled 2023-06-03: qty 2

## 2023-06-03 MED ORDER — LIDOCAINE 2% (20 MG/ML) 5 ML SYRINGE
INTRAMUSCULAR | Status: AC
  Filled 2023-06-03: qty 5

## 2023-06-03 MED ORDER — MIDAZOLAM HCL 2 MG/2ML IJ SOLN
INTRAMUSCULAR | Status: DC | PRN
  Administered 2023-06-03: 2 mg via INTRAVENOUS

## 2023-06-03 MED ORDER — CEFAZOLIN SODIUM-DEXTROSE 2-3 GM-%(50ML) IV SOLR
INTRAVENOUS | Status: DC | PRN
  Administered 2023-06-03: 2 g via INTRAVENOUS

## 2023-06-03 MED ORDER — OXYCODONE HCL 5 MG/5ML PO SOLN: 5.0000 mg | Freq: Once | ORAL | Status: DC | PRN

## 2023-06-03 MED ORDER — SCOPOLAMINE 1 MG/3DAYS TD PT72
MEDICATED_PATCH | TRANSDERMAL | Status: AC
  Filled 2023-06-03: qty 1

## 2023-06-03 SURGICAL SUPPLY — 45 items
APPLIER CLIP 9.375 MED OPEN (MISCELLANEOUS)
BENZOIN TINCTURE PRP APPL 2/3 (GAUZE/BANDAGES/DRESSINGS) ×1 IMPLANT
BLADE HEX COATED 2.75 (ELECTRODE) ×1 IMPLANT
BLADE SURG 15 STRL LF DISP TIS (BLADE) ×1 IMPLANT
CANISTER SUC SOCK COL 7IN (MISCELLANEOUS) IMPLANT
CANISTER SUCT 1200ML W/VALVE (MISCELLANEOUS) IMPLANT
CHLORAPREP W/TINT 26 (MISCELLANEOUS) ×1 IMPLANT
CLIP APPLIE 9.375 MED OPEN (MISCELLANEOUS) ×1 IMPLANT
COVER BACK TABLE 60X90IN (DRAPES) ×1 IMPLANT
COVER MAYO STAND STRL (DRAPES) ×1 IMPLANT
COVER PROBE CYLINDRICAL 5X96 (MISCELLANEOUS) ×1 IMPLANT
DRAPE LAPAROSCOPIC ABDOMINAL (DRAPES) IMPLANT
DRAPE LAPAROTOMY 100X72 PEDS (DRAPES) ×1 IMPLANT
DRAPE UTILITY XL STRL (DRAPES) ×1 IMPLANT
DRSG TEGADERM 4X4.75 (GAUZE/BANDAGES/DRESSINGS) ×1 IMPLANT
ELECT REM PT RETURN 9FT ADLT (ELECTROSURGICAL) ×1
ELECTRODE REM PT RTRN 9FT ADLT (ELECTROSURGICAL) ×1 IMPLANT
GAUZE SPONGE 2X2 STRL 8-PLY (GAUZE/BANDAGES/DRESSINGS) IMPLANT
GAUZE SPONGE 4X4 12PLY STRL LF (GAUZE/BANDAGES/DRESSINGS) IMPLANT
GLOVE BIO SURGEON STRL SZ7 (GLOVE) ×1 IMPLANT
GLOVE BIOGEL PI IND STRL 6.5 (GLOVE) IMPLANT
GLOVE BIOGEL PI IND STRL 7.0 (GLOVE) IMPLANT
GLOVE BIOGEL PI IND STRL 7.5 (GLOVE) ×1 IMPLANT
GLOVE ECLIPSE 6.5 STRL STRAW (GLOVE) IMPLANT
GOWN STRL REUS W/ TWL LRG LVL3 (GOWN DISPOSABLE) ×2 IMPLANT
KIT MARKER MARGIN INK (KITS) ×1 IMPLANT
NDL HYPO 25X1 1.5 SAFETY (NEEDLE) ×1 IMPLANT
NEEDLE HYPO 25X1 1.5 SAFETY (NEEDLE) ×1
NS IRRIG 1000ML POUR BTL (IV SOLUTION) ×1 IMPLANT
PACK BASIN DAY SURGERY FS (CUSTOM PROCEDURE TRAY) ×1 IMPLANT
PENCIL SMOKE EVACUATOR (MISCELLANEOUS) ×1 IMPLANT
SLEEVE SCD COMPRESS KNEE MED (STOCKING) ×1 IMPLANT
SPIKE FLUID TRANSFER (MISCELLANEOUS) IMPLANT
SPONGE T-LAP 18X18 ~~LOC~~+RFID (SPONGE) IMPLANT
SPONGE T-LAP 4X18 ~~LOC~~+RFID (SPONGE) ×1 IMPLANT
STRIP CLOSURE SKIN 1/2X4 (GAUZE/BANDAGES/DRESSINGS) ×1 IMPLANT
SUT MON AB 4-0 PC3 18 (SUTURE) ×1 IMPLANT
SUT SILK 2 0 SH (SUTURE) IMPLANT
SUT VIC AB 3-0 SH 27X BRD (SUTURE) ×1 IMPLANT
SYR BULB EAR ULCER 3OZ GRN STR (SYRINGE) IMPLANT
SYR CONTROL 10ML LL (SYRINGE) ×1 IMPLANT
TOWEL GREEN STERILE FF (TOWEL DISPOSABLE) ×1 IMPLANT
TRAY FAXITRON CT DISP (TRAY / TRAY PROCEDURE) ×1 IMPLANT
TUBE CONNECTING 20X1/4 (TUBING) IMPLANT
YANKAUER SUCT BULB TIP NO VENT (SUCTIONS) IMPLANT

## 2023-06-03 NOTE — Discharge Instructions (Addendum)
[facility] Office Phone Number [phone]  BREAST BIOPSY/ PARTIAL MASTECTOMY: POST OP INSTRUCTIONS  Always review your discharge instruction sheet given to you by the facility where your surgery was performed.  IF YOU HAVE DISABILITY OR FAMILY LEAVE FORMS, YOU MUST BRING THEM TO THE OFFICE FOR PROCESSING.  DO NOT GIVE THEM TO YOUR DOCTOR.  A prescription for pain medication may be given to you upon discharge.  Take your pain medication as prescribed, if needed.  If narcotic pain medicine is not needed, then you may take acetaminophen (Tylenol) or ibuprofen (Advil) as needed. Take your usually prescribed medications unless otherwise directed If you need a refill on your pain medication, please contact your pharmacy.  They will contact our office to request authorization.  Prescriptions will not be filled after 5pm or on week-ends. You should eat very light the first 24 hours after surgery, such as soup, crackers, pudding, etc.  Resume your normal diet the day after surgery. Most patients will experience some swelling and bruising in the breast.  Ice packs and a good support bra will help.  Swelling and bruising can take several days to resolve.  It is common to experience some constipation if taking pain medication after surgery.  Increasing fluid intake and taking a stool softener will usually help or prevent this problem from occurring.  A mild laxative (Milk of Magnesia or Miralax) should be taken according to package directions if there are no bowel movements after 48 hours. Unless discharge instructions indicate otherwise, you may remove your bandages 24-48 hours after surgery, and you may shower at that time.  You may have steri-strips (small skin tapes) in place directly over the incision.  These strips should be left on the skin for 7-10 days.  If your surgeon used skin glue on the incision, you may shower in 24 hours.  The glue will flake off over the next 2-3 weeks.  Any  sutures or staples will be removed at the office during your follow-up visit. ACTIVITIES:  You may resume regular daily activities (gradually increasing) beginning the next day.  Wearing a good support bra or sports bra minimizes pain and swelling.  You may have sexual intercourse when it is comfortable. You may drive when you no longer are taking prescription pain medication, you can comfortably wear a seatbelt, and you can safely maneuver your car and apply brakes. RETURN TO WORK:  ______________________________________________________________________________________ Katherine Ramirez should see your doctor in the office for a follow-up appointment approximately two weeks after your surgery.  Your doctor's nurse will typically make your follow-up appointment when she calls you with your pathology report.  Expect your pathology report 2-3 business days after your surgery.  You may call to check if you do not hear from [place] after three days. OTHER INSTRUCTIONS: _______________________________________________________________________________________________ _____________________________________________________________________________________________________________________________________ _____________________________________________________________________________________________________________________________________ _____________________________________________________________________________________________________________________________________  WHEN TO CALL YOUR DOCTOR: Fever over 101.0 Nausea and/or vomiting. Extreme swelling or bruising. Continued bleeding from incision. Increased pain, redness, or drainage from the incision.  The clinic staff is available to answer your questions during regular business hours.  Please don't hesitate to call and ask to speak to one of the nurses for clinical concerns.  If you have a medical emergency, go to the nearest emergency room or call 911.  A surgeon from [facility] is always on call at the hospital.  For further questions, please visit [email or web address]    Post Anesthesia Home Care Instructions  Activity: Get plenty of rest for the remainder of  the day. A responsible individual must stay with you for 24 hours following the procedure.  For the next 24 hours, DO NOT: -Drive a car -Advertising copywriter -Drink alcoholic beverages -Take any medication unless instructed by your physician -Make any legal decisions or sign important papers.  Meals: Start with liquid foods such as gelatin or soup. Progress to regular foods as tolerated. Avoid greasy, spicy, heavy foods. If nausea and/or vomiting occur, drink only clear liquids until the nausea and/or vomiting subsides. Call your physician if vomiting continues.  Special Instructions/Symptoms: Your throat may feel dry or sore from the anesthesia or the breathing tube placed in your throat during surgery. If this causes discomfort, gargle with warm salt water. The discomfort should disappear within 24 hours.  If you had a scopolamine patch placed behind your ear for the management of post- operative nausea and/or vomiting:  1. The medication in the patch is effective for 72 hours, after which it should be removed.  Wrap patch in a tissue and discard in the trash. Wash hands thoroughly with soap and water. 2. You may remove the patch earlier than 72 hours if you experience unpleasant side effects which may include dry mouth, dizziness or visual disturbances. 3. Avoid touching the patch. Wash your hands with soap and water after contact with the patch.    *May have Tylenol and Ibuprofen today at 4:15pm

## 2023-06-03 NOTE — Anesthesia Preprocedure Evaluation (Addendum)
Anesthesia Evaluation  Patient identified by MRN, date of birth, ID band Patient awake    Reviewed: Allergy & Precautions, NPO status , Patient's Chart, lab work & pertinent test results  History of Anesthesia Complications Negative for: history of anesthetic complications  Airway Mallampati: II  TM Distance: >3 FB Neck ROM: Full    Dental  (+) Dental Advisory Given, Chipped,    Pulmonary asthma , Current SmokerPatient did not abstain from smoking.   Pulmonary exam normal        Cardiovascular hypertension (no longer on meds), Normal cardiovascular exam     Neuro/Psych  PSYCHIATRIC DISORDERS   Bipolar Disorder   negative neurological ROS     GI/Hepatic negative GI ROS, Neg liver ROS,,,  Endo/Other  negative endocrine ROS    Renal/GU negative Renal ROS     Musculoskeletal negative musculoskeletal ROS (+)    Abdominal   Peds  Hematology negative hematology ROS (+)   Anesthesia Other Findings   Reproductive/Obstetrics  B/l intraductal papillomas                              Anesthesia Physical Anesthesia Plan  ASA: 2  Anesthesia Plan: General   Post-op Pain Management: Tylenol PO (pre-op)* and Celebrex PO (pre-op)*   Induction: Intravenous  PONV Risk Score and Plan: 2 and Treatment may vary due to age or medical condition, Ondansetron, Dexamethasone, Scopolamine patch - Pre-op and Midazolam  Airway Management Planned: LMA  Additional Equipment: None  Intra-op Plan:   Post-operative Plan: Extubation in OR  Informed Consent: I have reviewed the patients History and Physical, chart, labs and discussed the procedure including the risks, benefits and alternatives for the proposed anesthesia with the patient or authorized representative who has indicated his/her understanding and acceptance.     Dental advisory given  Plan Discussed with: CRNA and Anesthesiologist  Anesthesia  Plan Comments:        Anesthesia Quick Evaluation

## 2023-06-03 NOTE — Op Note (Addendum)
Pre-op Diagnosis: Bilateral intraductal papillomas Post-op Diagnosis: same Procedure: Bilateral radioactive seed localized excisional breast biopsies Surgeon:  Manus Rudd K. Anesthesia:  GEN - LMA Indications:  This is a 57 year old female who presents with recent left breast yellowish discharge and some intermittent left breast pain. No previous breast problems. No family history of breast cancer. She underwent workup including bilateral diagnostic mammograms and bilateral ultrasounds. In the right breast at 9:00 located 1 cm from the nipple there is an 8 x 8 x 4 mm mass. In the left breast at 3:00 located 5 cm from the nipple there is another subcentimeter mass. Both of these were biopsied and revealed sclerosed intraductal papillomas. No atypia was seen. She is referred to discuss excision. She states that the discharge has stopped from the left nipple. The intermittent pain that she feels corresponds to the area where she had the biopsy in the lateral left breast.   Description of procedure: The patient is brought to the operating room placed in supine position on the operating room table. After an adequate level of general anesthesia was obtained, both breasts were prepped with ChloraPrep and draped in sterile fashion. A timeout was taken to ensure the proper patient and proper procedure.   We began on the right side.  We interrogated the breast with the neoprobe. We made a circumareolar incision around the lateral side of the nipple after infiltrating with 0.25% Marcaine. Dissection was carried down in the breast tissue with cautery. We used the neoprobe to guide [place] towards the radioactive seed. We excised an area of tissue around the radioactive seed 1.5 cm. The specimen was removed and was oriented with a paint kit. Specimen mammogram showed the radioactive seed as well as the biopsy clip within the specimen. This was sent for pathologic examination. There is no residual radioactivity within the  biopsy cavity. We inspected carefully for hemostasis. The wound was thoroughly irrigated. The wound was closed with a deep layer of 3-0 Vicryl and a subcuticular layer of 4-0 Monocryl.  We then turned our attention to the left side.  There is a skin tag on the nipple which was excised. We interrogated the breast with the neoprobe. We made a circumareolar incision around the lateral side of the nipple after infiltrating with 0.25% Marcaine. Dissection was carried down in the breast tissue with cautery. We used the neoprobe to guide [place] towards the radioactive seed. We excised an area of tissue around the radioactive seed 2 cm in diameter.  The excision extended down to the chest wall.  The specimen was removed and was oriented with a paint kit. Specimen mammogram showed the radioactive seed as well as the biopsy clip within the specimen. This was sent for pathologic examination. There is no residual radioactivity within the biopsy cavity. We inspected carefully for hemostasis. The wound was thoroughly irrigated. The wound was closed with a deep layer of 3-0 Vicryl and a subcuticular layer of 4-0 Monocryl. Benzoin Steri-Strips were applied to both incisions. The patient was then extubated and brought to the recovery room in stable condition. All sponge, instrument, and needle counts are correct.  Wilmon Arms. Corliss Skains, MD, [facility]  General/ Trauma Surgery  06/03/2023 12:03 PM

## 2023-06-03 NOTE — Interval H&P Note (Signed)
History and Physical Interval Note:  06/03/2023 9:44 AM  Katherine Ramirez  has presented today for surgery, with the diagnosis of BILATERAL INTRADUCTAL PAPILLOMAS.  The various methods of treatment have been discussed with the patient and family. After consideration of risks, benefits and other options for treatment, the patient has consented to  Procedure(s) with comments: BILATERA RADIOACTIVE SEED LOCALIZED LUMPECTOMIES (Bilateral) - LMA as a surgical intervention.  The patient's history has been reviewed, patient examined, no change in status, stable for surgery.  I have reviewed the patient's chart and labs.  Questions were answered to the patient's satisfaction.     Wynona Luna

## 2023-06-03 NOTE — Transfer of Care (Signed)
Immediate Anesthesia Transfer of Care Note  Patient: Katherine Ramirez  Procedure(s) Performed: BILATERAL RADIOACTIVE SEED LOCALIZED LUMPECTOMIES (Bilateral: Breast)  Patient Location: PACU  Anesthesia Type:General  Level of Consciousness: awake, alert , and patient cooperative  Airway & Oxygen Therapy: Patient Spontanous Breathing and Patient connected to face mask oxygen  Post-op Assessment: Report given to RN and Post -op Vital signs reviewed and stable  Post vital signs: Reviewed and stable  Last Vitals:  Vitals Value Taken Time  BP    Temp    Pulse 91 06/03/23 1203  Resp 18 06/03/23 1203  SpO2 100 % 06/03/23 1203  Vitals shown include unfiled device data.  Last Pain:  Vitals:   06/03/23 1003  TempSrc: Tympanic  PainSc: 0-No pain      Patients Stated Pain Goal: 5 (06/03/23 1003)  Complications: No notable events documented.

## 2023-06-03 NOTE — Anesthesia Procedure Notes (Signed)
Procedure Name: LMA Insertion Date/Time: 06/03/2023 10:57 AM  Performed by: Karen Kitchens, CRNAPre-anesthesia Checklist: Patient identified, Emergency Drugs available, Suction available and Patient being monitored Patient Re-evaluated:Patient Re-evaluated prior to induction Oxygen Delivery Method: Circle system utilized Preoxygenation: Pre-oxygenation with 100% oxygen Induction Type: IV induction Ventilation: Mask ventilation without difficulty LMA: LMA inserted LMA Size: 4.0 Number of attempts: 1 Airway Equipment and Method: Bite block Placement Confirmation: positive ETCO2, breath sounds checked- equal and bilateral and CO2 detector Tube secured with: Tape Dental Injury: Teeth and Oropharynx as per pre-operative assessment

## 2023-06-03 NOTE — Anesthesia Postprocedure Evaluation (Signed)
Anesthesia Post Note  Patient: Katherine Ramirez  Procedure(s) Performed: BILATERAL RADIOACTIVE SEED LOCALIZED LUMPECTOMIES (Bilateral: Breast)     Patient location during evaluation: PACU Anesthesia Type: General Level of consciousness: awake and alert Pain management: pain level controlled Vital Signs Assessment: post-procedure vital signs reviewed and stable Respiratory status: spontaneous breathing, nonlabored ventilation and respiratory function stable Cardiovascular status: stable and blood pressure returned to baseline Anesthetic complications: no   No notable events documented.  Last Vitals:  Vitals:   06/03/23 1245 06/03/23 1308  BP: 107/69 121/79  Pulse: 73 84  Resp: 15 16  Temp:  36.8 C  SpO2: 95% 97%    Last Pain:  Vitals:   06/03/23 1308  TempSrc:   PainSc: 0-No pain                 Beryle Lathe

## 2023-06-04 ENCOUNTER — Encounter (HOSPITAL_BASED_OUTPATIENT_CLINIC_OR_DEPARTMENT_OTHER): Payer: Self-pay | Admitting: Surgery

## 2023-06-04 LAB — SURGICAL PATHOLOGY

## 2023-06-14 DIAGNOSIS — Z419 Encounter for procedure for purposes other than remedying health state, unspecified: Secondary | ICD-10-CM | POA: Diagnosis not present

## 2023-06-25 ENCOUNTER — Telehealth: Payer: Self-pay | Admitting: Family Medicine

## 2023-06-25 ENCOUNTER — Ambulatory Visit: Payer: 59 | Admitting: Family Medicine

## 2023-06-25 NOTE — Telephone Encounter (Signed)
Mom in hospital

## 2023-06-26 NOTE — Telephone Encounter (Signed)
1st missed visit, letter sent via mychart, pt has rescheduled for 12/23

## 2023-06-30 NOTE — Telephone Encounter (Signed)
Noted  

## 2023-07-06 ENCOUNTER — Ambulatory Visit: Payer: 59 | Admitting: Family Medicine

## 2023-07-15 DIAGNOSIS — Z419 Encounter for procedure for purposes other than remedying health state, unspecified: Secondary | ICD-10-CM | POA: Diagnosis not present

## 2023-08-15 DIAGNOSIS — Z419 Encounter for procedure for purposes other than remedying health state, unspecified: Secondary | ICD-10-CM | POA: Diagnosis not present

## 2023-09-12 DIAGNOSIS — Z419 Encounter for procedure for purposes other than remedying health state, unspecified: Secondary | ICD-10-CM | POA: Diagnosis not present

## 2023-10-05 ENCOUNTER — Ambulatory Visit: Payer: 59 | Admitting: Family Medicine

## 2023-10-12 ENCOUNTER — Ambulatory Visit: Admitting: Family Medicine

## 2023-10-13 ENCOUNTER — Ambulatory Visit: Admitting: Family Medicine

## 2023-10-14 ENCOUNTER — Telehealth: Payer: Self-pay | Admitting: Family Medicine

## 2023-10-14 ENCOUNTER — Encounter: Payer: Self-pay | Admitting: Family Medicine

## 2023-10-14 NOTE — Telephone Encounter (Signed)
 06/25/2023 same day cancellation/mom in hospital 10/05/2023 same day cancellation/transportation 10/12/2023 same day cancellation/in traffic coming home from [place] 10/13/2023 late arrival  (arrived 4:13p for 4:00p visit)  Final warning sent via mail and mychart

## 2023-10-18 ENCOUNTER — Encounter: Payer: Self-pay | Admitting: Family Medicine

## 2023-10-24 DIAGNOSIS — Z419 Encounter for procedure for purposes other than remedying health state, unspecified: Secondary | ICD-10-CM | POA: Diagnosis not present

## 2023-10-28 ENCOUNTER — Ambulatory Visit (INDEPENDENT_AMBULATORY_CARE_PROVIDER_SITE_OTHER): Admitting: Family Medicine

## 2023-10-28 ENCOUNTER — Other Ambulatory Visit (HOSPITAL_COMMUNITY): Payer: Self-pay

## 2023-10-28 ENCOUNTER — Encounter: Payer: Self-pay | Admitting: Family Medicine

## 2023-10-28 ENCOUNTER — Telehealth: Payer: Self-pay

## 2023-10-28 VITALS — BP 114/80 | HR 55 | Temp 97.9°F | Wt 158.0 lb

## 2023-10-28 DIAGNOSIS — M25512 Pain in left shoulder: Secondary | ICD-10-CM | POA: Diagnosis not present

## 2023-10-28 DIAGNOSIS — G8929 Other chronic pain: Secondary | ICD-10-CM

## 2023-10-28 DIAGNOSIS — F17200 Nicotine dependence, unspecified, uncomplicated: Secondary | ICD-10-CM

## 2023-10-28 DIAGNOSIS — L739 Follicular disorder, unspecified: Secondary | ICD-10-CM | POA: Diagnosis not present

## 2023-10-28 DIAGNOSIS — J441 Chronic obstructive pulmonary disease with (acute) exacerbation: Secondary | ICD-10-CM

## 2023-10-28 DIAGNOSIS — Z7689 Persons encountering health services in other specified circumstances: Secondary | ICD-10-CM | POA: Diagnosis not present

## 2023-10-28 MED ORDER — MELOXICAM 15 MG PO TABS: 15.0000 mg | ORAL_TABLET | Freq: Every day | ORAL | 0 refills | Status: DC

## 2023-10-28 MED ORDER — PREDNISONE 50 MG PO TABS: ORAL_TABLET | ORAL | 0 refills | Status: DC

## 2023-10-28 MED ORDER — ALBUTEROL SULFATE HFA 108 (90 BASE) MCG/ACT IN AERS: 2.0000 | INHALATION_SPRAY | RESPIRATORY_TRACT | 0 refills | Status: AC | PRN

## 2023-10-28 MED ORDER — LEVOFLOXACIN 500 MG PO TABS
500.0000 mg | ORAL_TABLET | Freq: Every day | ORAL | 0 refills | Status: AC
Start: 2023-10-28 — End: 2023-11-02

## 2023-10-28 MED ORDER — MUPIROCIN 2 % EX OINT: 1.0000 | TOPICAL_OINTMENT | Freq: Two times a day (BID) | CUTANEOUS | 0 refills | Status: AC

## 2023-10-28 MED ORDER — NICOTINE 21 MG/24HR TD PT24: 21.0000 mg | MEDICATED_PATCH | Freq: Every day | TRANSDERMAL | 0 refills | Status: DC

## 2023-10-28 MED ORDER — VARENICLINE TARTRATE 1 MG PO TABS: 1.0000 mg | ORAL_TABLET | Freq: Two times a day (BID) | ORAL | 3 refills | Status: DC

## 2023-10-28 MED ORDER — FLUTICASONE-SALMETEROL 500-50 MCG/ACT IN AEPB: 1.0000 | INHALATION_SPRAY | Freq: Two times a day (BID) | RESPIRATORY_TRACT | 3 refills | Status: AC

## 2023-10-28 MED ORDER — TRELEGY ELLIPTA 100-62.5-25 MCG/ACT IN AEPB: 1.0000 | INHALATION_SPRAY | Freq: Every day | RESPIRATORY_TRACT | 11 refills | Status: DC

## 2023-10-28 NOTE — Patient Instructions (Signed)
 VISIT SUMMARY:  During your visit, we discussed your concerns about smoking, congestion, cough, and left shoulder discomfort. We also addressed a growing bump on your right shoulder. We have developed a plan to manage these issues and improve your overall health.  YOUR PLAN:  -COPD EXACERBATION: You are experiencing a worsening of your chronic obstructive pulmonary disease (COPD), which is causing increased coughing, congestion, and wheezing. This is likely due to recurrent infections and your smoking history. We have prescribed prednisone 50 mg daily for 5 days and levofloxacin 500 mg daily for 5 days to help manage the exacerbation. You should continue using your albuterol inhaler as needed, 2 puffs every 4 hours for shortness of breath. Additionally, we are starting you on Trelegy (fluticasone/umeclidinium/vilanterol) with one puff daily to help control your COPD. Please follow up in 2 weeks to assess your respiratory status and response to treatment.  -TOBACCO USE DISORDER: You are currently smoking about three cigarettes a day and have expressed a desire to quit. We are restarting you on varenicline (Chantix) 1 mg twice daily and prescribing a nicotine 21 mg patch daily to help you quit smoking. Please continue your efforts to stop smoking and follow up in 2 weeks.  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 This pain is likely related to surgical complications or the healing process. We are referring you to orthopedics for further evaluation. In the meantime, you can take meloxicam 15 mg daily as needed for pain and continue using acetaminophen as needed.  -RIGHT SHOULDER NODULE: You have a small, tender bump on your right shoulder that has been growing over the past few months. This may be folliculitis, an infection of the hair follicles. We have prescribed mupirocin topical application twice daily for 2  weeks. Please follow up in 2 weeks to assess the response to treatment.  INSTRUCTIONS:  Please follow up in 2 weeks to assess your respiratory status, smoking cessation progress, and the response to treatment for your right shoulder nodule. Additionally, you will be referred to orthopedics for further evaluation of your left shoulder pain.

## 2023-10-28 NOTE — Telephone Encounter (Signed)
 Pharmacy Patient Advocate Encounter   Received notification from Onbase that prior authorization for Trelegy Ellipta 100-62.5-25MCG/ACT aerosol powder is required/requested.   Insurance verification completed.   The patient is insured through [facility] [place] .   Per test claim:  Advair Diskus, Advair HFA, Dulera or Symbicort is preferred by the insurance.  If suggested medication is appropriate, Please send in a new RX and discontinue this one. If not, please advise as to why it's not appropriate so that we may request a Prior Authorization. Please note, some preferred medications may still require a PA.  If the suggested medications have not been trialed and there are no contraindications to their use, the PA will not be submitted, as it will not be approved.

## 2023-10-28 NOTE — Progress Notes (Signed)
 Assessment/Plan:    Assessment & Plan COPD exacerbation Experiencing a COPD exacerbation with increased coughing, congestion, and wheezing. Using albuterol inhaler more frequently, approximately twice a day. No chest pain or significant dyspnea, but wheezing present on examination. Exacerbation likely due to recurrent infections and smoking history. - Prescribe prednisone 50 mg daily for 5 days - Prescribe levofloxacin 500 mg daily for 5 days - Refill albuterol inhaler for use as needed, 2 puffs every 4 hours for dyspnea - Initiate Trelegy (fluticasone/umeclidinium/vilanterol) one puff daily for COPD control - Follow up in 2 weeks to assess respiratory status and treatment response  Tobacco use disorder Currently smoking approximately three cigarettes daily. Previously used varenicline and nicotine patches but not recently. Expresses desire to quit smoking and has been reducing intake. Restarting varenicline and nicotine patches to aid cessation efforts. 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 Pain associated with movement and persistent since surgery in November. Experiences pulling sensations and discomfort, possibly related to surgical complications or healing processes. - Refer to orthopedics for evaluation of chronic left shoulder pain - Prescribe meloxicam 15 mg daily as needed for pain - Continue acetaminophen as needed for pain  Right shoulder nodule Small, tender nodule on the right shoulder, possibly folliculitis. Present for several months and increasing in size. Tender to touch but no purulent drainage. - Prescribe mupirocin topical application twice daily for 2 weeks - Re-evaluate in 2 weeks to assess response to treatment      Medications Discontinued During This  Encounter  Medication Reason   nicotine (NICODERM CQ) 21 mg/24hr patch Reorder   varenicline (CHANTIX) 1 MG tablet Reorder   albuterol (VENTOLIN HFA) 108 (90 Base) MCG/ACT inhaler Reorder    Return in about 2 weeks (around 11/11/2023) for copd, smoking.    Subjective:   Encounter date: 10/28/2023  Katherine Ramirez is a 58 y.o. female who has Chronic left shoulder pain; Vasomotor symptoms due to menopause; Tobacco use disorder; Heel spur, right; Chronic conjunctivitis of left eye; Plantar fasciitis; Tobacco use disorder, continuous; Intraductal papilloma; and Acute cough on their problem list..   She  has a past medical history of Asthma, Bipolar 1 disorder (HCC), Elevated bilirubin (10/31/2022), and Hypertension..   She presents with chief complaint of Medical Management of Chronic Issues (3 month follow up smoking. Left arm can't be lifted after surgery. Patient would like to discuss colonoscopy options. Congestion x 10/12/2023) .   Discussed the use of AI scribe software for clinical note transcription with the patient, who gave verbal consent to proceed.  History of Present Illness Katherine Ramirez is a 58 year old female with a history of lobectomy who presents with concerns about smoking, congestion, cough, and left shoulder discomfort.  She has been experiencing congestion and a productive cough since October 12, 2023, with significant mucus production. Symptoms worsen when lying down at night. She uses her albuterol inhaler more frequently, approximately twice a day. No chest pain, wheezing, or shortness of breath.  She smokes about three cigarettes a day. She has previously used Chantix and nicotine patches for smoking cessation but has not used them recently. She wants to reduce her smoking further.  She has left shoulder discomfort and pain, attributed to a previous lobectomy. She describes a pulling sensation and numbness, particularly when lifting her arm. She manages the pain  with  Tylenol, which provides some relief. The discomfort has been present since her surgery in November 2024.  She mentions a growing bump on her right shoulder, present for several months. It is tender and increasing in size, but its nature is uncertain.  Tobacco Use: High Risk (10/28/2023)   Patient History    Smoking Tobacco Use: Every Day    Smokeless Tobacco Use: Never    Passive Exposure: Never        Past Surgical History:  Procedure Laterality Date   APPENDECTOMY     BREAST BIOPSY Right 01/07/2023   [place] RT BREAST BX W LOC DEV 1ST LESION IMG BX SPEC US GUIDE 01/07/2023 [facility]   BREAST BIOPSY Left 01/07/2023   [place] LT BREAST BX W LOC DEV 1ST LESION IMG BX SPEC US GUIDE 01/07/2023 [facility]   BREAST BIOPSY  06/02/2023   MM RT RADIOACTIVE SEED LOC MAMMO GUIDE 06/02/2023 [facility]   BREAST BIOPSY  06/02/2023   MM LT RADIOACTIVE SEED LOC MAMMO GUIDE 06/02/2023 [facility]   BREAST LUMPECTOMY WITH RADIOACTIVE SEED LOCALIZATION Bilateral 06/03/2023   Procedure: BILATERAL RADIOACTIVE SEED LOCALIZED LUMPECTOMIES;  Surgeon: Manus Rudd, MD;  Location: [facility];  Service: General;  Laterality: Bilateral;  LMA   HERNIA REPAIR      Outpatient Medications Prior to Visit  Medication Sig Dispense Refill   albuterol (VENTOLIN HFA) 108 (90 Base) MCG/ACT inhaler Inhale 2 puffs into the lungs every 6 (six) hours as needed for wheezing or shortness of breath. 8 g 0   nicotine polacrilex (NICORETTE) 2 MG gum RX #1 Weeks 1-6: 1 piece every 1-2 hours.Use at least 9 pieces of gum per day for the first 6 weeks. Max 24 pieces per day. (Patient not taking: Reported on 04/23/2023) 1008 each 0   nicotine (NICODERM CQ) 21 mg/24hr patch Place 1 patch (21 mg total) onto the skin daily. (Patient not taking: Reported on 04/23/2023) 28 patch 0   varenicline (CHANTIX) 1 MG tablet Take 1 tablet (1 mg total) by mouth 2 (two) times daily. (Patient not taking: Reported  on 10/28/2023) 180 tablet 3   No facility-administered medications prior to visit.    Family History  Problem Relation Age of Onset   Hypertension Mother    Heart failure Father    Breast cancer Cousin    Cancer - Colon Neg Hx     Social History   Socioeconomic History   Marital status: Widowed    Spouse name: Not on file   Number of children: Not on file   Years of education: Not on file   Highest education level: Associate degree: academic program  Occupational History   Not on file  Tobacco Use   Smoking status: Every Day    Current packs/day: 0.10    Average packs/day: 1 pack/day for 51.3 years (50.5 ttl pk-yrs)    Types: Cigarettes    Start date: 73    Passive exposure: Never   Smokeless tobacco: Never  Vaping Use   Vaping status: Never Used  Substance and Sexual Activity   Alcohol use: Yes    Comment: Occasionally   Drug use: No   Sexual activity: Yes    Birth control/protection: Condom, I.U.D.  Other Topics Concern   Not on file  Social History Narrative   ** Merged History Encounter **       Social Drivers of Health   Financial Resource Strain: Medium Risk (07/02/2023)   Overall Financial Resource  Strain (CARDIA)    Difficulty of Paying Living Expenses: Somewhat hard  Food Insecurity: Food Insecurity Present (07/02/2023)   Hunger Vital Sign    Worried About Running Out of Food in the Last Year: Sometimes true    Ran Out of Food in the Last Year: Sometimes true  Transportation Needs: Unmet Transportation Needs (07/02/2023)   PRAPARE - Administrator, Civil Service (Medical): Yes    Lack of Transportation (Non-Medical): Yes  Physical Activity: Insufficiently Active (07/02/2023)   Exercise Vital Sign    Days of Exercise per Week: 1 day    Minutes of Exercise per Session: 20 min  Stress: No Stress Concern Present (07/02/2023)   [employer] of Occupational Health - Occupational Stress Questionnaire    Feeling of Stress : Only a  little  Social Connections: Moderately Integrated (07/02/2023)   Social Connection and Isolation Panel [NHANES]    Frequency of Communication with Friends and Family: More than three times a week    Frequency of Social Gatherings with Friends and Family: Once a week    Attends Religious Services: More than 4 times per year    Active Member of [employer] or Organizations: Yes    Attends Banker Meetings: More than 4 times per year    Marital Status: Widowed  Catering manager Violence: Not on file                                                                                                  Objective:  Physical Exam: BP 114/80   Pulse (!) 55   Temp 97.9 F (36.6 C) (Temporal)   Wt 158 lb (71.7 kg)   LMP 03/20/2016 (Exact Date)   SpO2 100%   BMI 27.99 kg/m    Physical Exam VITALS: SaO2- 100% GENERAL: Alert, cooperative, well developed, no acute distress HEENT: Normocephalic, normal oropharynx, moist mucous membranes CHEST: Scant wheezing throughout bilateral lung fields, no increased work of breathing CARDIOVASCULAR: Normal heart rate and rhythm, S1 and S2 normal without murmurs ABDOMEN: Soft, non-tender, non-distended, without organomegaly, normal bowel sounds EXTREMITIES: No cyanosis or edema, left shoulder pain with rotation, strength intact NEUROLOGICAL: Cranial nerves grossly intact, moves all extremities without gross motor or sensory deficit SKIN: Right back shoulder with a small tender nodule no drainage    Carnell Christian, MD, MS

## 2023-11-11 ENCOUNTER — Ambulatory Visit: Admitting: Family Medicine

## 2023-11-12 ENCOUNTER — Ambulatory Visit: Admitting: Family Medicine

## 2023-11-12 ENCOUNTER — Encounter: Payer: Self-pay | Admitting: Emergency Medicine

## 2023-11-12 ENCOUNTER — Encounter: Payer: Self-pay | Admitting: Family Medicine

## 2023-11-12 VITALS — BP 126/82 | HR 89 | Temp 97.5°F | Wt 155.2 lb

## 2023-11-12 DIAGNOSIS — T23122A Burn of first degree of single left finger (nail) except thumb, initial encounter: Secondary | ICD-10-CM | POA: Diagnosis not present

## 2023-11-12 DIAGNOSIS — G8929 Other chronic pain: Secondary | ICD-10-CM

## 2023-11-12 DIAGNOSIS — Z1211 Encounter for screening for malignant neoplasm of colon: Secondary | ICD-10-CM

## 2023-11-12 DIAGNOSIS — F17200 Nicotine dependence, unspecified, uncomplicated: Secondary | ICD-10-CM | POA: Diagnosis not present

## 2023-11-12 DIAGNOSIS — Z111 Encounter for screening for respiratory tuberculosis: Secondary | ICD-10-CM

## 2023-11-12 DIAGNOSIS — M25512 Pain in left shoulder: Secondary | ICD-10-CM

## 2023-11-12 DIAGNOSIS — J441 Chronic obstructive pulmonary disease with (acute) exacerbation: Secondary | ICD-10-CM

## 2023-11-12 MED ORDER — CVS MANUKA HONEY WOUND EX GEL: 1.0000 | Freq: Every day | CUTANEOUS | Status: AC

## 2023-11-12 NOTE — Progress Notes (Signed)
 Assessment/Plan:    Assessment & Plan COPD exacerbation Improvement in breathing following completion of prednisone  and levofloxacin . No recent use of albuterol  due to less humid weather. Breathing sounds improved on examination. Advair Diskus prescribed to prevent recurrent flares. - Order [facility] test for tuberculosis screening - Instruct to start using Advair Diskus 500/50, one puff twice a day - Continue Chantix  (varenicline ) one pill twice a day - Use nicotine  patches as needed  Nicotine  dependence Reduced smoking to five cigarettes a day, primarily when bored at home. Continues smoking cessation efforts with Chantix  and considering nicotine  patches. Encouraged to use quit line for additional support. - Continue Chantix  (varenicline ) one pill twice a day - Use nicotine  patches as needed - Encourage use of quit line for additional support  Osteoarthritis of left shoulder Left shoulder pain managed with meloxicam  15 mg as needed, which has been helpful. - Continue meloxicam  15 mg as needed for shoulder pain  Burn on hand Burn on left ring finger from cooking. No signs of infection. Using over-the-counter burn cream. Advised to keep the burn moist and clean. - Advise to keep burn moist with ointment and cover with a nonadhesive bandage - Instruct to clean with gentle soap and water - Monitor for signs of infection such as exudate or increased pain  General Health Maintenance Increased risk of lung cancer due to smoking history. Recommended CT scan for lung cancer screening. Colon cancer screening referral to gastroenterology to be reinitiated. - Set up CT scan for lung cancer screening - Reinitiate referral for colon cancer screening with gastroenterology      Medications Discontinued During This Encounter  Medication Reason   predniSONE  (DELTASONE ) 50 MG tablet     Return if symptoms worsen or fail to improve.    Subjective:   Encounter date:  11/12/2023  Katherine Ramirez is a 58 y.o. female who has Chronic left shoulder pain; Vasomotor symptoms due to menopause; Tobacco use disorder; Heel spur, right; Chronic conjunctivitis of left eye; Plantar fasciitis; Tobacco use disorder, continuous; Intraductal papilloma; Acute cough; and Chronic obstructive pulmonary disease with acute exacerbation (HCC) on their problem list..   She  has a past medical history of Asthma, Bipolar 1 disorder (HCC), Elevated bilirubin (10/31/2022), and Hypertension..   She presents with chief complaint of Medical Management of Chronic Issues (2 weeks (around 11/11/2023) for copd, smoking. Burn on left ring finger x  2 weeks. ) .   Discussed the use of AI scribe software for clinical note transcription with the patient, who gave verbal consent to proceed.  History of Present Illness Katherine Ramirez is a 58 year old female with COPD who presents for follow-up on smoking cessation and lung health.  She has experienced improvement in her breathing since her last visit. She recently completed a course of prednisone  and levofloxacin , which alleviated her symptoms, including cough and chest congestion. She uses albuterol  less frequently, primarily in humid weather, and has started using Advair Diskus after resolving an insurance issue.  She is actively working on smoking cessation, reducing her smoking to five cigarettes a day, mainly when bored at home. She is using Chantix  (varenicline ) 1 mg twice daily to aid in smoking cessation. 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  She sustained a recent burn on her ring finger while cooking, which is painful but not infected. She is treating it with over-the-counter burn cream and is concerned about keeping it  moist and clean.  She uses meloxicam  15 mg as needed for left shoulder arthritis, which she finds helpful.     ROS  Past Surgical History:  Procedure Laterality Date    APPENDECTOMY     BREAST BIOPSY Right 01/07/2023   US  RT BREAST BX W LOC DEV 1ST LESION IMG BX SPEC US  GUIDE 01/07/2023 [facility]   BREAST BIOPSY Left 01/07/2023   US  LT BREAST BX W LOC DEV 1ST LESION IMG BX SPEC US  GUIDE 01/07/2023 [facility]   BREAST BIOPSY  06/02/2023   MM RT RADIOACTIVE SEED LOC MAMMO GUIDE 06/02/2023 [facility]   BREAST BIOPSY  06/02/2023   MM LT RADIOACTIVE SEED LOC MAMMO GUIDE 06/02/2023 [facility]   BREAST LUMPECTOMY WITH RADIOACTIVE SEED LOCALIZATION Bilateral 06/03/2023   Procedure: BILATERAL RADIOACTIVE SEED LOCALIZED LUMPECTOMIES;  Surgeon: Dareen Ebbing, MD;  Location: [facility];  Service: General;  Laterality: Bilateral;  LMA   HERNIA REPAIR      Outpatient Medications Prior to Visit  Medication Sig Dispense Refill   albuterol  (VENTOLIN  HFA) 108 (90 Base) MCG/ACT inhaler Inhale 2 puffs into the lungs every 4 (four) hours as needed for wheezing or shortness of breath. 8 g 0   fluticasone -salmeterol (ADVAIR DISKUS) 500-50 MCG/ACT AEPB Inhale 1 puff into the lungs in the morning and at bedtime. 180 each 3   meloxicam  (MOBIC ) 15 MG tablet Take 1 tablet (15 mg total) by mouth daily. 30 tablet 0   nicotine  (NICODERM CQ ) 21 mg/24hr patch Place 1 patch (21 mg total) onto the skin daily. 28 patch 0   varenicline  (CHANTIX ) 1 MG tablet Take 1 tablet (1 mg total) by mouth 2 (two) times daily. 180 tablet 3   predniSONE  (DELTASONE ) 50 MG tablet Take 1 tablet daily for 5 days. 5 tablet 0   nicotine  polacrilex (NICORETTE ) 2 MG gum RX #1 Weeks 1-6: 1 piece every 1-2 hours.Use at least 9 pieces of gum per day for the first 6 weeks. Max 24 pieces per day. (Patient not taking: Reported on 04/23/2023) 1008 each 0   No facility-administered medications prior to visit.    Family History  Problem Relation Age of Onset   Hypertension Mother    Heart failure Father    Breast cancer Cousin    Cancer - Colon Neg Hx     Social  History   Socioeconomic History   Marital status: Widowed    Spouse name: Not on file   Number of children: Not on file   Years of education: Not on file   Highest education level: Associate degree: academic program  Occupational History   Not on file  Tobacco Use   Smoking status: Every Day    Current packs/day: 0.10    Average packs/day: 1 pack/day for 51.3 years (50.5 ttl pk-yrs)    Types: Cigarettes    Start date: 67    Passive exposure: Never   Smokeless tobacco: Never   Tobacco comments:    5 cigarettes a day  Vaping Use   Vaping status: Never Used  Substance and Sexual Activity   Alcohol use: Yes    Comment: Occasionally   Drug use: No   Sexual activity: Yes    Birth control/protection: Condom, I.U.D.  Other Topics Concern   Not on file  Social History Narrative   ** Merged History Encounter **       Social Drivers of Health   Financial Resource Strain: Medium Risk (07/02/2023)   Overall  Financial Resource Strain (CARDIA)    Difficulty of Paying Living Expenses: Somewhat hard  Food Insecurity: Food Insecurity Present (07/02/2023)   Hunger Vital Sign    Worried About Running Out of Food in the Last Year: Sometimes true    Ran Out of Food in the Last Year: Sometimes true  Transportation Needs: Unmet Transportation Needs (07/02/2023)   PRAPARE - Administrator, Civil Service (Medical): Yes    Lack of Transportation (Non-Medical): Yes  Physical Activity: Insufficiently Active (07/02/2023)   Exercise Vital Sign    Days of Exercise per Week: 1 day    Minutes of Exercise per Session: 20 min  Stress: No Stress Concern Present (07/02/2023)   [employer] of Occupational Health - Occupational Stress Questionnaire    Feeling of Stress : Only a little  Social Connections: Moderately Integrated (07/02/2023)   Social Connection and Isolation Panel [NHANES]    Frequency of Communication with Friends and Family: More than three times a week     Frequency of Social Gatherings with Friends and Family: Once a week    Attends Religious Services: More than 4 times per year    Active Member of [employer] or Organizations: Yes    Attends Banker Meetings: More than 4 times per year    Marital Status: Widowed  Catering manager Violence: Not on file                                                                                                  Objective:  Physical Exam: BP 126/82   Pulse 89   Temp (!) 97.5 F (36.4 C) (Temporal)   Wt 155 lb 3.2 oz (70.4 kg)   LMP 03/20/2016 (Exact Date)   SpO2 98%   BMI 27.49 kg/m    Physical Exam GENERAL: Alert, cooperative, well developed, no acute distress. HEENT: Normocephalic, normal oropharynx, moist mucous membranes. CHEST: Clear to auscultation bilaterally, no wheezes, rhonchi, or crackles. CARDIOVASCULAR: Normal heart rate and rhythm, S1 and S2 normal without murmurs. ABDOMEN: Soft, non-tender, non-distended, without organomegaly, normal bowel sounds. EXTREMITIES: No cyanosis or edema. NEUROLOGICAL: Cranial nerves grossly intact, moves all extremities without gross motor or sensory deficit. SKIN: Superficial first-degree burn on left fourth digit about 1 cm in diameter, no drainage or signs of infection.     No results found.  No results found for this or any previous visit (from the past 2160 hours).      Carnell Christian, MD, MS

## 2023-11-12 NOTE — Patient Instructions (Signed)
  VISIT SUMMARY: During your visit today, we discussed your progress with COPD, smoking cessation, and other health concerns. You have shown improvement in your breathing and are actively working on reducing your smoking. We also addressed your recent burn injury and ongoing shoulder pain.  YOUR PLAN: -COPD EXACERBATION: COPD, or Chronic Obstructive Pulmonary Disease, is a chronic lung condition that makes it hard to breathe. Your breathing has improved after completing prednisone  and levofloxacin . You should start using Advair Diskus 500/50, one puff twice a day, to prevent future flare-ups. Continue taking Chantix  (varenicline ) one pill twice a day and use nicotine  patches as needed. We will also screen you for tuberculosis with a Quantiferon Gold test.  -NICOTINE  DEPENDENCE: Nicotine  dependence means you are addicted to nicotine , a substance found in cigarettes. You have reduced your smoking to five cigarettes a day and are using Chantix  to help quit. Continue taking Chantix  one pill twice a day, use nicotine  patches as needed, and consider using the quit line for additional support.  -OSTEOARTHRITIS OF LEFT SHOULDER: Osteoarthritis is a condition that causes joint pain and stiffness. Your left shoulder pain is being managed with meloxicam  15 mg as needed. Continue taking meloxicam  as needed for pain relief.  -BURN ON HAND: You have a burn on your ring finger from cooking. It is not infected, and you are using over-the-counter burn cream. Keep the burn moist with ointment, cover it with a Band-Aid, and clean it with gentle soap and water. Watch for signs of infection like increased pain or discharge.  -GENERAL HEALTH MAINTENANCE: Due to your smoking history, you are at increased risk for lung cancer. We recommend a CT scan for lung cancer screening. Additionally, we will reinitiate a referral for colon cancer screening with gastroenterology.  INSTRUCTIONS: Please follow up with the recommended CT  scan for lung cancer screening and the referral for colon cancer screening with gastroenterology. Continue with your current medications and follow the advice provided for each of your health concerns. Monitor your burn for any signs of infection and keep it clean and moist.                      Contains text generated by [employer]bridge.                                 Contains text generated by [employer]bridge.

## 2023-11-14 LAB — QUANTIFERON-TB GOLD PLUS
Mitogen-NIL: >10 [IU]/mL
NIL: 0.03 [IU]/mL
QuantiFERON-TB Gold Plus: NEGATIVE
TB1-NIL: 0.01 [IU]/mL
TB2-NIL: 0 [IU]/mL

## 2023-11-19 ENCOUNTER — Encounter: Payer: Self-pay | Admitting: Family Medicine

## 2023-11-23 DIAGNOSIS — Z419 Encounter for procedure for purposes other than remedying health state, unspecified: Secondary | ICD-10-CM | POA: Diagnosis not present

## 2023-12-08 ENCOUNTER — Encounter: Payer: Self-pay | Admitting: Family Medicine

## 2023-12-08 ENCOUNTER — Telehealth: Payer: Self-pay | Admitting: Family Medicine

## 2023-12-08 NOTE — Telephone Encounter (Signed)
 06/25/2023 same day cancellation, 10/05/2023 same day cancellation, 10/12/2023 same day cancellation, 10/13/2023 late arrival, and 11/11/2023 same day cancellation   Dismissal sent via mail and mychart

## 2023-12-24 DIAGNOSIS — Z419 Encounter for procedure for purposes other than remedying health state, unspecified: Secondary | ICD-10-CM | POA: Diagnosis not present

## 2024-01-23 DIAGNOSIS — Z419 Encounter for procedure for purposes other than remedying health state, unspecified: Secondary | ICD-10-CM | POA: Diagnosis not present

## 2024-02-23 DIAGNOSIS — Z419 Encounter for procedure for purposes other than remedying health state, unspecified: Secondary | ICD-10-CM | POA: Diagnosis not present

## 2024-03-25 DIAGNOSIS — Z419 Encounter for procedure for purposes other than remedying health state, unspecified: Secondary | ICD-10-CM | POA: Diagnosis not present

## 2024-04-05 ENCOUNTER — Other Ambulatory Visit: Payer: Self-pay

## 2024-04-05 ENCOUNTER — Ambulatory Visit (HOSPITAL_COMMUNITY)
Admission: EM | Admit: 2024-04-05 | Discharge: 2024-04-05 | Disposition: A | Discharge status: Home / Self Care | Attending: Student | Admitting: Student

## 2024-04-05 ENCOUNTER — Encounter (HOSPITAL_COMMUNITY): Payer: Self-pay | Admitting: *Deleted

## 2024-04-05 DIAGNOSIS — M5412 Radiculopathy, cervical region: Secondary | ICD-10-CM | POA: Diagnosis not present

## 2024-04-05 DIAGNOSIS — M79605 Pain in left leg: Secondary | ICD-10-CM | POA: Diagnosis not present

## 2024-04-05 MED ORDER — METHYLPREDNISOLONE SODIUM SUCC 125 MG IJ SOLR
INTRAMUSCULAR | Status: AC
  Filled 2024-04-05: qty 2

## 2024-04-05 MED ORDER — METHYLPREDNISOLONE SODIUM SUCC 125 MG IJ SOLR
80.0000 mg | Freq: Once | INTRAMUSCULAR | Status: AC
Start: 2024-04-05 — End: 2024-04-05
  Administered 2024-04-05: 80 mg via INTRAVENOUS

## 2024-04-05 MED ORDER — BACLOFEN 10 MG PO TABS: 10.0000 mg | ORAL_TABLET | Freq: Three times a day (TID) | ORAL | 0 refills | Status: AC

## 2024-04-05 NOTE — ED Triage Notes (Signed)
 Pt reports she first had Lt sided neck pain one week ago . Pt now reports pain down the entire LT side of body. 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  Pt has been talking BC powders for pain . Last dose last night.

## 2024-04-05 NOTE — Discharge Instructions (Signed)
-  Baclofen  up to three times daily for muscular pain and pinched nerve. This can make you sleepy. -You can continue BC powder, ice/heat, Lidocaine  patch at home

## 2024-04-05 NOTE — ED Provider Notes (Signed)
 [facility]    CSN: [id number] Arrival date & time: 04/05/24  9060      History   Chief Complaint Chief Complaint  Patient presents with   Shoulder Pain   Neck Pain    HPI Katherine Ramirez is a 58 y.o. female presenting with L shoulder/leg pain for 1 week. She does have a h/o back issues including sciatica, and dislocated L shoulder. L shoulder feels worse with abduction of the arm to 90 degrees of higher. The pain radiates from the neck to the shoulder. Movement makes her L anterior thigh spasm. She has attempted lidocaine  patch, icy hot, Salonpas, ice, BC powder. OTC treatments only provide temporary relief.  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   Acute cough 03/26/2023   Plantar fasciitis 12/16/2022   Vasomotor symptoms due to menopause 10/31/2022   Tobacco use disorder 10/31/2022   Heel spur, right 10/31/2022   Chronic left shoulder pain 07/29/2022   Chronic conjunctivitis of left eye 07/29/2022    Past Surgical History:  Procedure Laterality Date   APPENDECTOMY     BREAST BIOPSY Right 01/07/2023   US  RT BREAST BX W LOC DEV 1ST LESION IMG BX SPEC US  GUIDE 01/07/2023 [facility]   BREAST BIOPSY Left 01/07/2023   US  LT BREAST BX W LOC DEV 1ST LESION IMG BX SPEC US  GUIDE 01/07/2023 [facility]   BREAST BIOPSY  06/02/2023   MM RT RADIOACTIVE SEED LOC MAMMO GUIDE 06/02/2023 [facility]   BREAST BIOPSY  06/02/2023   MM LT RADIOACTIVE SEED LOC MAMMO GUIDE 06/02/2023 [facility]   BREAST LUMPECTOMY WITH RADIOACTIVE SEED LOCALIZATION Bilateral 06/03/2023   Procedure: BILATERAL RADIOACTIVE SEED LOCALIZED LUMPECTOMIES;  Surgeon: Belinda Cough, MD;  Location: [facility];  Service: General;  Laterality: Bilateral;  LMA   HERNIA REPAIR      OB History   No obstetric history on file.      Home Medications    Prior to Admission medications   Medication Sig Start Date End Date Taking? Authorizing Provider  albuterol  (VENTOLIN  HFA) 108 (90 Base) MCG/ACT inhaler Inhale 2 puffs into the lungs every 4 (four) hours as needed for wheezing or shortness of breath. 10/28/23  Yes Sebastian Beverley NOVAK, MD  baclofen  (LIORESAL ) 10 MG tablet Take 1 tablet (10 mg total) by mouth 3 (three) times daily. 04/05/24  Yes Nihal Doan E, PA-C  fluticasone -salmeterol (ADVAIR DISKUS) 500-50 MCG/ACT AEPB Inhale 1 puff into the lungs in the morning and at bedtime. 10/28/23 10/22/24 Yes Sebastian Beverley NOVAK, MD  Wound Dressings ([facility] HONEY WOUND) GEL Apply 1 Application topically daily at 12 noon. 11/12/23   Sebastian Beverley NOVAK, MD    Family History Family History  Problem Relation Age of Onset   Hypertension Mother    Heart failure Father    Breast cancer Cousin    Cancer - Colon Neg Hx     Social History Social History   Tobacco Use   Smoking status: Every Day    Current packs/day: 0.10    Average packs/day: 1 pack/day for 51.7 years (50.5 ttl pk-yrs)    Types: Cigarettes    Start  date: 1974    Passive exposure: Never   Smokeless tobacco: Never   Tobacco comments:    5 cigarettes a day  Vaping Use   Vaping status: Never Used  Substance Use Topics   Alcohol use: Yes    Comment: Occasionally   Drug use: No     Allergies   Grass pollen([id number]-o-r-t-swt vern) and Lanolin   Review of Systems Review of Systems  Musculoskeletal:  Positive for neck pain.     Physical Exam Triage Vital Signs ED Triage Vitals [04/05/24 0955]  Encounter Vitals Group     BP (!) 147/83     Girls Systolic BP Percentile      Girls Diastolic BP Percentile      Boys Systolic BP Percentile      Boys Diastolic BP Percentile      Pulse Rate  68     Resp 20     Temp 98.5 F (36.9 C)     Temp src      SpO2 95 %     Weight      Height      Head Circumference      Peak Flow      Pain Score 10     Pain Loc      Pain Education      Exclude from Growth Chart    No data found.  Updated Vital Signs BP (!) 147/83   Pulse 68   Temp 98.5 F (36.9 C)   Resp 20   LMP 03/20/2016 (Exact Date)   SpO2 95%   Visual Acuity Right Eye Distance:   Left Eye Distance:   Bilateral Distance:    Right Eye Near:   Left Eye Near:    Bilateral Near:     Physical Exam Vitals reviewed.  Constitutional:      General: She is not in acute distress.    Appearance: Normal appearance. She is not ill-appearing.  HENT:     Head: Normocephalic and atraumatic.  Cardiovascular:     Rate and Rhythm: Normal rate and regular rhythm.     Heart sounds: Normal heart sounds.  Pulmonary:     Effort: Pulmonary effort is normal.     Breath sounds: Normal breath sounds and air entry.  Abdominal:     Tenderness: There is no abdominal tenderness. There is no right CVA tenderness, left CVA tenderness, guarding or rebound.  Musculoskeletal:     Cervical back: Normal range of motion. Spasms present. No swelling, deformity, signs of trauma, rigidity, tenderness, bony tenderness or crepitus. No pain with movement.     Thoracic back: No swelling, deformity, signs of trauma, spasms, tenderness or bony tenderness. Normal range of motion. No scoliosis.     Lumbar back: No swelling, deformity, signs of trauma, spasms, tenderness or bony tenderness. Normal range of motion. Negative right straight leg raise test and negative left straight leg raise test. No scoliosis.     Comments: Proximal L trapezius tender to palpation. Pain elicited with abduction L arm to 90 degrees or higher. Negative spurling.  No midline spinous tenderness, deformity, stepoff.  Absolutely no other injury, deformity, tenderness, ecchymosis, abrasion.  L anterior thigh tenderness elicited  with movement, walking.  Neurological:     General: No focal deficit present.     Mental Status: She is alert.     Cranial Nerves: No cranial nerve deficit.  Psychiatric:        Mood and Affect: Mood normal.  Behavior: Behavior normal.        Thought Content: Thought content normal.        Judgment: Judgment normal.      UC Treatments / Results  Labs (all labs ordered are listed, but only abnormal results are displayed) Labs Reviewed - No data to display  EKG   Radiology No results found.  Procedures Procedures (including critical care time)  Medications Ordered in UC Medications  methylPREDNISolone  sodium succinate (SOLU-MEDROL ) 125 mg/2 mL injection 80 mg (80 mg Intravenous Given 04/05/24 1026)    Initial Impression / Assessment and Plan / UC Course  I have reviewed the triage vital signs and the nursing notes.  Pertinent labs & imaging results that were available during my care of the patient were reviewed by me and considered in my medical decision making (see chart for details).     Will manage cervical radiculitis, trapezius strain, and L anterior thigh strain with baclofen , solumedrol IM. She is not a diabetic. She will continue OTC interventions. Red flag symptoms and return precautions discussed.     Final Clinical Impressions(s) / UC Diagnoses   Final diagnoses:  Cervical radiculitis  Leg pain, anterior, left     Discharge Instructions      -Baclofen  up to three times daily for muscular pain and pinched nerve. This can make you sleepy. -You can continue BC powder, ice/heat, Lidocaine  patch at home     ED Prescriptions     Medication Sig Dispense Auth. Provider   baclofen  (LIORESAL ) 10 MG tablet Take 1 tablet (10 mg total) by mouth 3 (three) times daily. 30 each Arlyss Leita BRAVO, PA-C      PDMP not reviewed this encounter.   Arlyss Leita BRAVO, PA-C 04/05/24 1043

## 2024-04-24 DIAGNOSIS — Z419 Encounter for procedure for purposes other than remedying health state, unspecified: Secondary | ICD-10-CM | POA: Diagnosis not present

## 2024-05-25 DIAGNOSIS — Z419 Encounter for procedure for purposes other than remedying health state, unspecified: Secondary | ICD-10-CM | POA: Diagnosis not present
